# Patient Record
Sex: Male | Born: 1940 | Race: Black or African American | Hispanic: No | Smoking: Former smoker
Health system: Southern US, Community
[De-identification: ages and names within clinical notes are randomized; demographics above are authoritative.]

## PROBLEM LIST (undated history)

## (undated) DIAGNOSIS — E119 Type 2 diabetes mellitus without complications: Secondary | ICD-10-CM

## (undated) DIAGNOSIS — R413 Other amnesia: Secondary | ICD-10-CM

## (undated) DIAGNOSIS — N400 Enlarged prostate without lower urinary tract symptoms: Secondary | ICD-10-CM

## (undated) DIAGNOSIS — E785 Hyperlipidemia, unspecified: Secondary | ICD-10-CM

## (undated) DIAGNOSIS — I10 Essential (primary) hypertension: Secondary | ICD-10-CM

## (undated) DIAGNOSIS — E538 Deficiency of other specified B group vitamins: Secondary | ICD-10-CM

## (undated) HISTORY — DX: Essential (primary) hypertension: I10

## (undated) HISTORY — PX: TOE SURGERY: SHX1073

## (undated) HISTORY — DX: Type 2 diabetes mellitus without complications: E11.9

## (undated) HISTORY — DX: Benign prostatic hyperplasia without lower urinary tract symptoms: N40.0

## (undated) HISTORY — DX: Other amnesia: R41.3

## (undated) HISTORY — DX: Hyperlipidemia, unspecified: E78.5

## (undated) HISTORY — DX: Deficiency of other specified B group vitamins: E53.8

---

## 1998-01-10 HISTORY — PX: SHOULDER SURGERY: SHX246

## 2011-01-19 DIAGNOSIS — IMO0002 Reserved for concepts with insufficient information to code with codable children: Secondary | ICD-10-CM | POA: Diagnosis not present

## 2011-01-19 DIAGNOSIS — M47817 Spondylosis without myelopathy or radiculopathy, lumbosacral region: Secondary | ICD-10-CM | POA: Diagnosis not present

## 2011-01-26 DIAGNOSIS — M47812 Spondylosis without myelopathy or radiculopathy, cervical region: Secondary | ICD-10-CM | POA: Diagnosis not present

## 2011-01-26 DIAGNOSIS — M47817 Spondylosis without myelopathy or radiculopathy, lumbosacral region: Secondary | ICD-10-CM | POA: Diagnosis not present

## 2011-01-26 DIAGNOSIS — Z79899 Other long term (current) drug therapy: Secondary | ICD-10-CM | POA: Diagnosis not present

## 2011-01-26 DIAGNOSIS — IMO0002 Reserved for concepts with insufficient information to code with codable children: Secondary | ICD-10-CM | POA: Diagnosis not present

## 2011-01-26 DIAGNOSIS — M5126 Other intervertebral disc displacement, lumbar region: Secondary | ICD-10-CM | POA: Diagnosis not present

## 2011-01-26 DIAGNOSIS — M545 Low back pain: Secondary | ICD-10-CM | POA: Diagnosis not present

## 2011-02-16 DIAGNOSIS — M47812 Spondylosis without myelopathy or radiculopathy, cervical region: Secondary | ICD-10-CM | POA: Diagnosis not present

## 2011-02-16 DIAGNOSIS — M47817 Spondylosis without myelopathy or radiculopathy, lumbosacral region: Secondary | ICD-10-CM | POA: Diagnosis not present

## 2011-02-16 DIAGNOSIS — IMO0002 Reserved for concepts with insufficient information to code with codable children: Secondary | ICD-10-CM | POA: Diagnosis not present

## 2011-02-23 DIAGNOSIS — IMO0002 Reserved for concepts with insufficient information to code with codable children: Secondary | ICD-10-CM | POA: Diagnosis not present

## 2011-02-23 DIAGNOSIS — M47817 Spondylosis without myelopathy or radiculopathy, lumbosacral region: Secondary | ICD-10-CM | POA: Diagnosis not present

## 2011-03-02 DIAGNOSIS — IMO0002 Reserved for concepts with insufficient information to code with codable children: Secondary | ICD-10-CM | POA: Diagnosis not present

## 2011-03-02 DIAGNOSIS — Z79899 Other long term (current) drug therapy: Secondary | ICD-10-CM | POA: Diagnosis not present

## 2011-03-02 DIAGNOSIS — M25519 Pain in unspecified shoulder: Secondary | ICD-10-CM | POA: Diagnosis not present

## 2011-03-02 DIAGNOSIS — M5126 Other intervertebral disc displacement, lumbar region: Secondary | ICD-10-CM | POA: Diagnosis not present

## 2011-03-02 DIAGNOSIS — M545 Low back pain: Secondary | ICD-10-CM | POA: Diagnosis not present

## 2011-03-02 DIAGNOSIS — M47817 Spondylosis without myelopathy or radiculopathy, lumbosacral region: Secondary | ICD-10-CM | POA: Diagnosis not present

## 2011-03-09 DIAGNOSIS — M25519 Pain in unspecified shoulder: Secondary | ICD-10-CM | POA: Diagnosis not present

## 2011-03-09 DIAGNOSIS — M47817 Spondylosis without myelopathy or radiculopathy, lumbosacral region: Secondary | ICD-10-CM | POA: Diagnosis not present

## 2011-03-09 DIAGNOSIS — IMO0002 Reserved for concepts with insufficient information to code with codable children: Secondary | ICD-10-CM | POA: Diagnosis not present

## 2011-03-16 DIAGNOSIS — M47817 Spondylosis without myelopathy or radiculopathy, lumbosacral region: Secondary | ICD-10-CM | POA: Diagnosis not present

## 2011-03-16 DIAGNOSIS — IMO0002 Reserved for concepts with insufficient information to code with codable children: Secondary | ICD-10-CM | POA: Diagnosis not present

## 2011-03-17 DIAGNOSIS — I10 Essential (primary) hypertension: Secondary | ICD-10-CM | POA: Diagnosis not present

## 2011-03-17 DIAGNOSIS — I2510 Atherosclerotic heart disease of native coronary artery without angina pectoris: Secondary | ICD-10-CM | POA: Diagnosis not present

## 2011-03-23 DIAGNOSIS — IMO0002 Reserved for concepts with insufficient information to code with codable children: Secondary | ICD-10-CM | POA: Diagnosis not present

## 2011-03-23 DIAGNOSIS — M47817 Spondylosis without myelopathy or radiculopathy, lumbosacral region: Secondary | ICD-10-CM | POA: Diagnosis not present

## 2011-04-06 DIAGNOSIS — M25519 Pain in unspecified shoulder: Secondary | ICD-10-CM | POA: Diagnosis not present

## 2011-04-06 DIAGNOSIS — IMO0002 Reserved for concepts with insufficient information to code with codable children: Secondary | ICD-10-CM | POA: Diagnosis not present

## 2011-04-06 DIAGNOSIS — R269 Unspecified abnormalities of gait and mobility: Secondary | ICD-10-CM | POA: Diagnosis not present

## 2011-04-06 DIAGNOSIS — M47817 Spondylosis without myelopathy or radiculopathy, lumbosacral region: Secondary | ICD-10-CM | POA: Diagnosis not present

## 2011-04-13 DIAGNOSIS — M25519 Pain in unspecified shoulder: Secondary | ICD-10-CM | POA: Diagnosis not present

## 2011-04-13 DIAGNOSIS — IMO0002 Reserved for concepts with insufficient information to code with codable children: Secondary | ICD-10-CM | POA: Diagnosis not present

## 2011-04-13 DIAGNOSIS — M47817 Spondylosis without myelopathy or radiculopathy, lumbosacral region: Secondary | ICD-10-CM | POA: Diagnosis not present

## 2011-04-20 DIAGNOSIS — M47817 Spondylosis without myelopathy or radiculopathy, lumbosacral region: Secondary | ICD-10-CM | POA: Diagnosis not present

## 2011-04-20 DIAGNOSIS — M47812 Spondylosis without myelopathy or radiculopathy, cervical region: Secondary | ICD-10-CM | POA: Diagnosis not present

## 2011-04-20 DIAGNOSIS — IMO0002 Reserved for concepts with insufficient information to code with codable children: Secondary | ICD-10-CM | POA: Diagnosis not present

## 2011-04-27 DIAGNOSIS — IMO0002 Reserved for concepts with insufficient information to code with codable children: Secondary | ICD-10-CM | POA: Diagnosis not present

## 2011-04-27 DIAGNOSIS — M47817 Spondylosis without myelopathy or radiculopathy, lumbosacral region: Secondary | ICD-10-CM | POA: Diagnosis not present

## 2011-04-27 DIAGNOSIS — M25519 Pain in unspecified shoulder: Secondary | ICD-10-CM | POA: Diagnosis not present

## 2011-05-04 DIAGNOSIS — M47817 Spondylosis without myelopathy or radiculopathy, lumbosacral region: Secondary | ICD-10-CM | POA: Diagnosis not present

## 2011-05-04 DIAGNOSIS — Z79899 Other long term (current) drug therapy: Secondary | ICD-10-CM | POA: Diagnosis not present

## 2011-05-04 DIAGNOSIS — M25519 Pain in unspecified shoulder: Secondary | ICD-10-CM | POA: Diagnosis not present

## 2011-05-04 DIAGNOSIS — M5126 Other intervertebral disc displacement, lumbar region: Secondary | ICD-10-CM | POA: Diagnosis not present

## 2011-05-04 DIAGNOSIS — IMO0002 Reserved for concepts with insufficient information to code with codable children: Secondary | ICD-10-CM | POA: Diagnosis not present

## 2011-05-04 DIAGNOSIS — M545 Low back pain: Secondary | ICD-10-CM | POA: Diagnosis not present

## 2011-05-18 DIAGNOSIS — IMO0002 Reserved for concepts with insufficient information to code with codable children: Secondary | ICD-10-CM | POA: Diagnosis not present

## 2011-05-18 DIAGNOSIS — M25519 Pain in unspecified shoulder: Secondary | ICD-10-CM | POA: Diagnosis not present

## 2011-05-18 DIAGNOSIS — M47817 Spondylosis without myelopathy or radiculopathy, lumbosacral region: Secondary | ICD-10-CM | POA: Diagnosis not present

## 2011-05-25 DIAGNOSIS — IMO0002 Reserved for concepts with insufficient information to code with codable children: Secondary | ICD-10-CM | POA: Diagnosis not present

## 2011-05-25 DIAGNOSIS — M19019 Primary osteoarthritis, unspecified shoulder: Secondary | ICD-10-CM | POA: Diagnosis not present

## 2011-05-25 DIAGNOSIS — M47817 Spondylosis without myelopathy or radiculopathy, lumbosacral region: Secondary | ICD-10-CM | POA: Diagnosis not present

## 2011-06-01 DIAGNOSIS — M47817 Spondylosis without myelopathy or radiculopathy, lumbosacral region: Secondary | ICD-10-CM | POA: Diagnosis not present

## 2011-06-01 DIAGNOSIS — IMO0002 Reserved for concepts with insufficient information to code with codable children: Secondary | ICD-10-CM | POA: Diagnosis not present

## 2011-06-08 DIAGNOSIS — M47817 Spondylosis without myelopathy or radiculopathy, lumbosacral region: Secondary | ICD-10-CM | POA: Diagnosis not present

## 2011-06-08 DIAGNOSIS — IMO0002 Reserved for concepts with insufficient information to code with codable children: Secondary | ICD-10-CM | POA: Diagnosis not present

## 2011-06-15 DIAGNOSIS — Z79899 Other long term (current) drug therapy: Secondary | ICD-10-CM | POA: Diagnosis not present

## 2011-06-15 DIAGNOSIS — IMO0002 Reserved for concepts with insufficient information to code with codable children: Secondary | ICD-10-CM | POA: Diagnosis not present

## 2011-06-15 DIAGNOSIS — M47817 Spondylosis without myelopathy or radiculopathy, lumbosacral region: Secondary | ICD-10-CM | POA: Diagnosis not present

## 2011-06-15 DIAGNOSIS — M545 Low back pain: Secondary | ICD-10-CM | POA: Diagnosis not present

## 2011-06-22 DIAGNOSIS — M47817 Spondylosis without myelopathy or radiculopathy, lumbosacral region: Secondary | ICD-10-CM | POA: Diagnosis not present

## 2011-06-22 DIAGNOSIS — IMO0002 Reserved for concepts with insufficient information to code with codable children: Secondary | ICD-10-CM | POA: Diagnosis not present

## 2011-06-22 DIAGNOSIS — M25519 Pain in unspecified shoulder: Secondary | ICD-10-CM | POA: Diagnosis not present

## 2011-06-23 DIAGNOSIS — I2510 Atherosclerotic heart disease of native coronary artery without angina pectoris: Secondary | ICD-10-CM | POA: Diagnosis not present

## 2011-06-23 DIAGNOSIS — I10 Essential (primary) hypertension: Secondary | ICD-10-CM | POA: Diagnosis not present

## 2011-06-29 DIAGNOSIS — IMO0002 Reserved for concepts with insufficient information to code with codable children: Secondary | ICD-10-CM | POA: Diagnosis not present

## 2011-06-29 DIAGNOSIS — M47817 Spondylosis without myelopathy or radiculopathy, lumbosacral region: Secondary | ICD-10-CM | POA: Diagnosis not present

## 2011-07-06 DIAGNOSIS — M47817 Spondylosis without myelopathy or radiculopathy, lumbosacral region: Secondary | ICD-10-CM | POA: Diagnosis not present

## 2011-07-06 DIAGNOSIS — IMO0002 Reserved for concepts with insufficient information to code with codable children: Secondary | ICD-10-CM | POA: Diagnosis not present

## 2011-07-06 DIAGNOSIS — M25519 Pain in unspecified shoulder: Secondary | ICD-10-CM | POA: Diagnosis not present

## 2011-07-13 DIAGNOSIS — IMO0002 Reserved for concepts with insufficient information to code with codable children: Secondary | ICD-10-CM | POA: Diagnosis not present

## 2011-07-13 DIAGNOSIS — M47817 Spondylosis without myelopathy or radiculopathy, lumbosacral region: Secondary | ICD-10-CM | POA: Diagnosis not present

## 2011-07-20 DIAGNOSIS — M47817 Spondylosis without myelopathy or radiculopathy, lumbosacral region: Secondary | ICD-10-CM | POA: Diagnosis not present

## 2011-07-20 DIAGNOSIS — M25519 Pain in unspecified shoulder: Secondary | ICD-10-CM | POA: Diagnosis not present

## 2011-07-20 DIAGNOSIS — Z79899 Other long term (current) drug therapy: Secondary | ICD-10-CM | POA: Diagnosis not present

## 2011-07-20 DIAGNOSIS — M545 Low back pain: Secondary | ICD-10-CM | POA: Diagnosis not present

## 2011-07-20 DIAGNOSIS — IMO0002 Reserved for concepts with insufficient information to code with codable children: Secondary | ICD-10-CM | POA: Diagnosis not present

## 2011-07-25 DIAGNOSIS — D649 Anemia, unspecified: Secondary | ICD-10-CM | POA: Diagnosis not present

## 2011-07-25 DIAGNOSIS — E789 Disorder of lipoprotein metabolism, unspecified: Secondary | ICD-10-CM | POA: Diagnosis not present

## 2011-07-25 DIAGNOSIS — I10 Essential (primary) hypertension: Secondary | ICD-10-CM | POA: Diagnosis not present

## 2011-07-25 DIAGNOSIS — I2510 Atherosclerotic heart disease of native coronary artery without angina pectoris: Secondary | ICD-10-CM | POA: Diagnosis not present

## 2011-07-25 DIAGNOSIS — R809 Proteinuria, unspecified: Secondary | ICD-10-CM | POA: Diagnosis not present

## 2011-07-27 DIAGNOSIS — M47817 Spondylosis without myelopathy or radiculopathy, lumbosacral region: Secondary | ICD-10-CM | POA: Diagnosis not present

## 2011-07-27 DIAGNOSIS — IMO0002 Reserved for concepts with insufficient information to code with codable children: Secondary | ICD-10-CM | POA: Diagnosis not present

## 2011-08-17 DIAGNOSIS — Z79899 Other long term (current) drug therapy: Secondary | ICD-10-CM | POA: Diagnosis not present

## 2011-08-17 DIAGNOSIS — M545 Low back pain: Secondary | ICD-10-CM | POA: Diagnosis not present

## 2011-08-17 DIAGNOSIS — M47817 Spondylosis without myelopathy or radiculopathy, lumbosacral region: Secondary | ICD-10-CM | POA: Diagnosis not present

## 2011-08-17 DIAGNOSIS — IMO0002 Reserved for concepts with insufficient information to code with codable children: Secondary | ICD-10-CM | POA: Diagnosis not present

## 2011-08-24 DIAGNOSIS — IMO0002 Reserved for concepts with insufficient information to code with codable children: Secondary | ICD-10-CM | POA: Diagnosis not present

## 2011-08-24 DIAGNOSIS — M47817 Spondylosis without myelopathy or radiculopathy, lumbosacral region: Secondary | ICD-10-CM | POA: Diagnosis not present

## 2011-08-25 DIAGNOSIS — I10 Essential (primary) hypertension: Secondary | ICD-10-CM | POA: Diagnosis not present

## 2011-08-25 DIAGNOSIS — R319 Hematuria, unspecified: Secondary | ICD-10-CM | POA: Diagnosis not present

## 2011-08-25 DIAGNOSIS — M545 Low back pain: Secondary | ICD-10-CM | POA: Diagnosis not present

## 2011-08-25 DIAGNOSIS — I2510 Atherosclerotic heart disease of native coronary artery without angina pectoris: Secondary | ICD-10-CM | POA: Diagnosis not present

## 2011-08-31 DIAGNOSIS — IMO0002 Reserved for concepts with insufficient information to code with codable children: Secondary | ICD-10-CM | POA: Diagnosis not present

## 2011-08-31 DIAGNOSIS — M47817 Spondylosis without myelopathy or radiculopathy, lumbosacral region: Secondary | ICD-10-CM | POA: Diagnosis not present

## 2011-09-06 DIAGNOSIS — R319 Hematuria, unspecified: Secondary | ICD-10-CM | POA: Diagnosis not present

## 2011-09-06 DIAGNOSIS — K4090 Unilateral inguinal hernia, without obstruction or gangrene, not specified as recurrent: Secondary | ICD-10-CM | POA: Diagnosis not present

## 2011-09-07 DIAGNOSIS — IMO0002 Reserved for concepts with insufficient information to code with codable children: Secondary | ICD-10-CM | POA: Diagnosis not present

## 2011-09-07 DIAGNOSIS — M47817 Spondylosis without myelopathy or radiculopathy, lumbosacral region: Secondary | ICD-10-CM | POA: Diagnosis not present

## 2011-09-22 DIAGNOSIS — R3129 Other microscopic hematuria: Secondary | ICD-10-CM | POA: Diagnosis not present

## 2011-09-28 DIAGNOSIS — M47817 Spondylosis without myelopathy or radiculopathy, lumbosacral region: Secondary | ICD-10-CM | POA: Diagnosis not present

## 2011-09-28 DIAGNOSIS — M25519 Pain in unspecified shoulder: Secondary | ICD-10-CM | POA: Diagnosis not present

## 2011-09-30 DIAGNOSIS — R7989 Other specified abnormal findings of blood chemistry: Secondary | ICD-10-CM | POA: Diagnosis not present

## 2011-09-30 DIAGNOSIS — R042 Hemoptysis: Secondary | ICD-10-CM | POA: Diagnosis not present

## 2011-09-30 DIAGNOSIS — R972 Elevated prostate specific antigen [PSA]: Secondary | ICD-10-CM | POA: Diagnosis not present

## 2011-09-30 DIAGNOSIS — R634 Abnormal weight loss: Secondary | ICD-10-CM | POA: Diagnosis not present

## 2011-10-05 DIAGNOSIS — M25519 Pain in unspecified shoulder: Secondary | ICD-10-CM | POA: Diagnosis not present

## 2011-10-05 DIAGNOSIS — IMO0002 Reserved for concepts with insufficient information to code with codable children: Secondary | ICD-10-CM | POA: Diagnosis not present

## 2011-10-05 DIAGNOSIS — M47817 Spondylosis without myelopathy or radiculopathy, lumbosacral region: Secondary | ICD-10-CM | POA: Diagnosis not present

## 2011-10-07 DIAGNOSIS — R3129 Other microscopic hematuria: Secondary | ICD-10-CM | POA: Diagnosis not present

## 2011-10-07 DIAGNOSIS — N189 Chronic kidney disease, unspecified: Secondary | ICD-10-CM | POA: Diagnosis not present

## 2011-10-07 DIAGNOSIS — R3989 Other symptoms and signs involving the genitourinary system: Secondary | ICD-10-CM | POA: Diagnosis not present

## 2011-10-12 DIAGNOSIS — M545 Low back pain: Secondary | ICD-10-CM | POA: Diagnosis not present

## 2011-10-12 DIAGNOSIS — M47817 Spondylosis without myelopathy or radiculopathy, lumbosacral region: Secondary | ICD-10-CM | POA: Diagnosis not present

## 2011-10-12 DIAGNOSIS — IMO0002 Reserved for concepts with insufficient information to code with codable children: Secondary | ICD-10-CM | POA: Diagnosis not present

## 2011-10-12 DIAGNOSIS — M25519 Pain in unspecified shoulder: Secondary | ICD-10-CM | POA: Diagnosis not present

## 2011-10-12 DIAGNOSIS — Z79899 Other long term (current) drug therapy: Secondary | ICD-10-CM | POA: Diagnosis not present

## 2011-10-13 DIAGNOSIS — R319 Hematuria, unspecified: Secondary | ICD-10-CM | POA: Diagnosis not present

## 2011-10-13 DIAGNOSIS — I2510 Atherosclerotic heart disease of native coronary artery without angina pectoris: Secondary | ICD-10-CM | POA: Diagnosis not present

## 2011-10-13 DIAGNOSIS — I10 Essential (primary) hypertension: Secondary | ICD-10-CM | POA: Diagnosis not present

## 2011-10-14 DIAGNOSIS — N400 Enlarged prostate without lower urinary tract symptoms: Secondary | ICD-10-CM | POA: Diagnosis not present

## 2011-10-14 DIAGNOSIS — R3129 Other microscopic hematuria: Secondary | ICD-10-CM | POA: Diagnosis not present

## 2011-10-26 DIAGNOSIS — M25519 Pain in unspecified shoulder: Secondary | ICD-10-CM | POA: Diagnosis not present

## 2011-10-26 DIAGNOSIS — M47817 Spondylosis without myelopathy or radiculopathy, lumbosacral region: Secondary | ICD-10-CM | POA: Diagnosis not present

## 2011-10-26 DIAGNOSIS — IMO0002 Reserved for concepts with insufficient information to code with codable children: Secondary | ICD-10-CM | POA: Diagnosis not present

## 2011-11-02 DIAGNOSIS — IMO0002 Reserved for concepts with insufficient information to code with codable children: Secondary | ICD-10-CM | POA: Diagnosis not present

## 2011-11-02 DIAGNOSIS — M25519 Pain in unspecified shoulder: Secondary | ICD-10-CM | POA: Diagnosis not present

## 2011-11-02 DIAGNOSIS — M47817 Spondylosis without myelopathy or radiculopathy, lumbosacral region: Secondary | ICD-10-CM | POA: Diagnosis not present

## 2011-11-11 DIAGNOSIS — IMO0002 Reserved for concepts with insufficient information to code with codable children: Secondary | ICD-10-CM | POA: Diagnosis not present

## 2011-11-11 DIAGNOSIS — M47817 Spondylosis without myelopathy or radiculopathy, lumbosacral region: Secondary | ICD-10-CM | POA: Diagnosis not present

## 2011-11-16 DIAGNOSIS — M25519 Pain in unspecified shoulder: Secondary | ICD-10-CM | POA: Diagnosis not present

## 2011-11-16 DIAGNOSIS — IMO0002 Reserved for concepts with insufficient information to code with codable children: Secondary | ICD-10-CM | POA: Diagnosis not present

## 2011-11-16 DIAGNOSIS — M47817 Spondylosis without myelopathy or radiculopathy, lumbosacral region: Secondary | ICD-10-CM | POA: Diagnosis not present

## 2011-11-23 DIAGNOSIS — M47817 Spondylosis without myelopathy or radiculopathy, lumbosacral region: Secondary | ICD-10-CM | POA: Diagnosis not present

## 2011-11-23 DIAGNOSIS — IMO0002 Reserved for concepts with insufficient information to code with codable children: Secondary | ICD-10-CM | POA: Diagnosis not present

## 2011-11-25 DIAGNOSIS — R079 Chest pain, unspecified: Secondary | ICD-10-CM | POA: Diagnosis not present

## 2011-11-25 DIAGNOSIS — I2510 Atherosclerotic heart disease of native coronary artery without angina pectoris: Secondary | ICD-10-CM | POA: Diagnosis not present

## 2011-11-30 DIAGNOSIS — M47817 Spondylosis without myelopathy or radiculopathy, lumbosacral region: Secondary | ICD-10-CM | POA: Diagnosis not present

## 2011-11-30 DIAGNOSIS — IMO0002 Reserved for concepts with insufficient information to code with codable children: Secondary | ICD-10-CM | POA: Diagnosis not present

## 2011-11-30 DIAGNOSIS — M25519 Pain in unspecified shoulder: Secondary | ICD-10-CM | POA: Diagnosis not present

## 2011-11-30 DIAGNOSIS — M545 Low back pain: Secondary | ICD-10-CM | POA: Diagnosis not present

## 2011-11-30 DIAGNOSIS — Z79899 Other long term (current) drug therapy: Secondary | ICD-10-CM | POA: Diagnosis not present

## 2011-12-01 DIAGNOSIS — I2510 Atherosclerotic heart disease of native coronary artery without angina pectoris: Secondary | ICD-10-CM | POA: Diagnosis not present

## 2011-12-01 DIAGNOSIS — I517 Cardiomegaly: Secondary | ICD-10-CM | POA: Diagnosis not present

## 2011-12-01 DIAGNOSIS — I10 Essential (primary) hypertension: Secondary | ICD-10-CM | POA: Diagnosis not present

## 2011-12-07 DIAGNOSIS — M47817 Spondylosis without myelopathy or radiculopathy, lumbosacral region: Secondary | ICD-10-CM | POA: Diagnosis not present

## 2011-12-14 DIAGNOSIS — M47817 Spondylosis without myelopathy or radiculopathy, lumbosacral region: Secondary | ICD-10-CM | POA: Diagnosis not present

## 2011-12-21 DIAGNOSIS — M47817 Spondylosis without myelopathy or radiculopathy, lumbosacral region: Secondary | ICD-10-CM | POA: Diagnosis not present

## 2011-12-28 DIAGNOSIS — M47817 Spondylosis without myelopathy or radiculopathy, lumbosacral region: Secondary | ICD-10-CM | POA: Diagnosis not present

## 2012-01-18 DIAGNOSIS — M5126 Other intervertebral disc displacement, lumbar region: Secondary | ICD-10-CM | POA: Diagnosis not present

## 2012-01-18 DIAGNOSIS — M47817 Spondylosis without myelopathy or radiculopathy, lumbosacral region: Secondary | ICD-10-CM | POA: Diagnosis not present

## 2012-01-21 DIAGNOSIS — M47817 Spondylosis without myelopathy or radiculopathy, lumbosacral region: Secondary | ICD-10-CM | POA: Diagnosis not present

## 2012-01-21 DIAGNOSIS — Z79899 Other long term (current) drug therapy: Secondary | ICD-10-CM | POA: Diagnosis not present

## 2012-01-25 DIAGNOSIS — M47817 Spondylosis without myelopathy or radiculopathy, lumbosacral region: Secondary | ICD-10-CM | POA: Diagnosis not present

## 2012-02-01 DIAGNOSIS — M47817 Spondylosis without myelopathy or radiculopathy, lumbosacral region: Secondary | ICD-10-CM | POA: Diagnosis not present

## 2012-02-06 DIAGNOSIS — M47817 Spondylosis without myelopathy or radiculopathy, lumbosacral region: Secondary | ICD-10-CM | POA: Diagnosis not present

## 2012-02-09 DIAGNOSIS — R9431 Abnormal electrocardiogram [ECG] [EKG]: Secondary | ICD-10-CM | POA: Diagnosis not present

## 2012-02-09 DIAGNOSIS — I10 Essential (primary) hypertension: Secondary | ICD-10-CM | POA: Diagnosis not present

## 2012-02-09 DIAGNOSIS — I499 Cardiac arrhythmia, unspecified: Secondary | ICD-10-CM | POA: Diagnosis not present

## 2012-02-09 DIAGNOSIS — I2510 Atherosclerotic heart disease of native coronary artery without angina pectoris: Secondary | ICD-10-CM | POA: Diagnosis not present

## 2012-03-23 DIAGNOSIS — I514 Myocarditis, unspecified: Secondary | ICD-10-CM | POA: Diagnosis not present

## 2012-04-03 DIAGNOSIS — R319 Hematuria, unspecified: Secondary | ICD-10-CM | POA: Diagnosis not present

## 2012-04-03 DIAGNOSIS — I498 Other specified cardiac arrhythmias: Secondary | ICD-10-CM | POA: Diagnosis not present

## 2012-04-06 DIAGNOSIS — Z79899 Other long term (current) drug therapy: Secondary | ICD-10-CM | POA: Diagnosis not present

## 2012-04-06 DIAGNOSIS — M47817 Spondylosis without myelopathy or radiculopathy, lumbosacral region: Secondary | ICD-10-CM | POA: Diagnosis not present

## 2012-04-27 DIAGNOSIS — M47817 Spondylosis without myelopathy or radiculopathy, lumbosacral region: Secondary | ICD-10-CM | POA: Diagnosis not present

## 2012-05-04 DIAGNOSIS — H01009 Unspecified blepharitis unspecified eye, unspecified eyelid: Secondary | ICD-10-CM | POA: Diagnosis not present

## 2012-05-04 DIAGNOSIS — H5200 Hypermetropia, unspecified eye: Secondary | ICD-10-CM | POA: Diagnosis not present

## 2012-05-04 DIAGNOSIS — H2510 Age-related nuclear cataract, unspecified eye: Secondary | ICD-10-CM | POA: Diagnosis not present

## 2012-05-04 DIAGNOSIS — H43819 Vitreous degeneration, unspecified eye: Secondary | ICD-10-CM | POA: Diagnosis not present

## 2012-05-07 DIAGNOSIS — H9190 Unspecified hearing loss, unspecified ear: Secondary | ICD-10-CM | POA: Diagnosis not present

## 2012-05-30 DIAGNOSIS — H903 Sensorineural hearing loss, bilateral: Secondary | ICD-10-CM | POA: Diagnosis not present

## 2012-08-17 DIAGNOSIS — H903 Sensorineural hearing loss, bilateral: Secondary | ICD-10-CM | POA: Diagnosis not present

## 2012-10-30 ENCOUNTER — Other Ambulatory Visit: Payer: Self-pay | Admitting: Internal Medicine

## 2012-10-30 ENCOUNTER — Ambulatory Visit
Admission: RE | Admit: 2012-10-30 | Discharge: 2012-10-30 | Disposition: A | Discharge status: Home / Self Care | Payer: Medicare Other | Source: Ambulatory Visit | Attending: Internal Medicine | Admitting: Internal Medicine

## 2012-10-30 DIAGNOSIS — M25552 Pain in left hip: Secondary | ICD-10-CM

## 2012-10-30 DIAGNOSIS — M545 Low back pain, unspecified: Secondary | ICD-10-CM

## 2012-10-30 DIAGNOSIS — M25559 Pain in unspecified hip: Secondary | ICD-10-CM | POA: Diagnosis not present

## 2012-10-30 DIAGNOSIS — Z23 Encounter for immunization: Secondary | ICD-10-CM | POA: Diagnosis not present

## 2012-10-30 DIAGNOSIS — R030 Elevated blood-pressure reading, without diagnosis of hypertension: Secondary | ICD-10-CM | POA: Diagnosis not present

## 2012-10-30 DIAGNOSIS — M542 Cervicalgia: Secondary | ICD-10-CM | POA: Diagnosis not present

## 2012-10-30 DIAGNOSIS — M47817 Spondylosis without myelopathy or radiculopathy, lumbosacral region: Secondary | ICD-10-CM | POA: Diagnosis not present

## 2012-11-08 DIAGNOSIS — R7989 Other specified abnormal findings of blood chemistry: Secondary | ICD-10-CM | POA: Diagnosis not present

## 2012-11-08 DIAGNOSIS — R7309 Other abnormal glucose: Secondary | ICD-10-CM | POA: Diagnosis not present

## 2012-11-15 DIAGNOSIS — I10 Essential (primary) hypertension: Secondary | ICD-10-CM | POA: Diagnosis not present

## 2012-11-15 DIAGNOSIS — E119 Type 2 diabetes mellitus without complications: Secondary | ICD-10-CM | POA: Diagnosis not present

## 2012-11-15 DIAGNOSIS — E785 Hyperlipidemia, unspecified: Secondary | ICD-10-CM | POA: Diagnosis not present

## 2013-02-08 DIAGNOSIS — R7989 Other specified abnormal findings of blood chemistry: Secondary | ICD-10-CM | POA: Diagnosis not present

## 2013-02-12 DIAGNOSIS — Z23 Encounter for immunization: Secondary | ICD-10-CM | POA: Diagnosis not present

## 2013-02-12 DIAGNOSIS — R413 Other amnesia: Secondary | ICD-10-CM | POA: Diagnosis not present

## 2013-02-12 DIAGNOSIS — I10 Essential (primary) hypertension: Secondary | ICD-10-CM | POA: Diagnosis not present

## 2013-02-12 DIAGNOSIS — E119 Type 2 diabetes mellitus without complications: Secondary | ICD-10-CM | POA: Diagnosis not present

## 2013-02-12 DIAGNOSIS — M25519 Pain in unspecified shoulder: Secondary | ICD-10-CM | POA: Diagnosis not present

## 2013-02-12 DIAGNOSIS — Z1331 Encounter for screening for depression: Secondary | ICD-10-CM | POA: Diagnosis not present

## 2013-02-13 DIAGNOSIS — M719 Bursopathy, unspecified: Secondary | ICD-10-CM | POA: Diagnosis not present

## 2013-02-13 DIAGNOSIS — M67919 Unspecified disorder of synovium and tendon, unspecified shoulder: Secondary | ICD-10-CM | POA: Diagnosis not present

## 2013-02-13 DIAGNOSIS — M5030 Other cervical disc degeneration, unspecified cervical region: Secondary | ICD-10-CM | POA: Diagnosis not present

## 2013-02-13 DIAGNOSIS — M545 Low back pain, unspecified: Secondary | ICD-10-CM | POA: Diagnosis not present

## 2013-02-21 DIAGNOSIS — M67919 Unspecified disorder of synovium and tendon, unspecified shoulder: Secondary | ICD-10-CM | POA: Diagnosis not present

## 2013-02-21 DIAGNOSIS — M719 Bursopathy, unspecified: Secondary | ICD-10-CM | POA: Diagnosis not present

## 2013-02-21 DIAGNOSIS — M25519 Pain in unspecified shoulder: Secondary | ICD-10-CM | POA: Diagnosis not present

## 2013-02-26 ENCOUNTER — Ambulatory Visit: Payer: Medicare Other | Admitting: Diagnostic Neuroimaging

## 2013-02-27 DIAGNOSIS — M25519 Pain in unspecified shoulder: Secondary | ICD-10-CM | POA: Diagnosis not present

## 2013-02-28 DIAGNOSIS — M25519 Pain in unspecified shoulder: Secondary | ICD-10-CM | POA: Diagnosis not present

## 2013-02-28 DIAGNOSIS — M67919 Unspecified disorder of synovium and tendon, unspecified shoulder: Secondary | ICD-10-CM | POA: Diagnosis not present

## 2013-02-28 DIAGNOSIS — M719 Bursopathy, unspecified: Secondary | ICD-10-CM | POA: Diagnosis not present

## 2013-03-07 DIAGNOSIS — M67919 Unspecified disorder of synovium and tendon, unspecified shoulder: Secondary | ICD-10-CM | POA: Diagnosis not present

## 2013-03-07 DIAGNOSIS — M719 Bursopathy, unspecified: Secondary | ICD-10-CM | POA: Diagnosis not present

## 2013-03-07 DIAGNOSIS — M25519 Pain in unspecified shoulder: Secondary | ICD-10-CM | POA: Diagnosis not present

## 2013-03-11 DIAGNOSIS — M25519 Pain in unspecified shoulder: Secondary | ICD-10-CM | POA: Diagnosis not present

## 2013-03-12 DIAGNOSIS — R413 Other amnesia: Secondary | ICD-10-CM | POA: Diagnosis not present

## 2013-03-15 DIAGNOSIS — M25519 Pain in unspecified shoulder: Secondary | ICD-10-CM | POA: Diagnosis not present

## 2013-03-20 ENCOUNTER — Ambulatory Visit: Payer: Self-pay

## 2013-03-20 ENCOUNTER — Other Ambulatory Visit: Payer: Self-pay | Admitting: Internal Medicine

## 2013-03-20 DIAGNOSIS — M25519 Pain in unspecified shoulder: Secondary | ICD-10-CM | POA: Diagnosis not present

## 2013-03-20 DIAGNOSIS — M5430 Sciatica, unspecified side: Secondary | ICD-10-CM

## 2013-03-27 DIAGNOSIS — M542 Cervicalgia: Secondary | ICD-10-CM | POA: Diagnosis not present

## 2013-04-02 DIAGNOSIS — M25519 Pain in unspecified shoulder: Secondary | ICD-10-CM | POA: Diagnosis not present

## 2013-04-05 DIAGNOSIS — M25519 Pain in unspecified shoulder: Secondary | ICD-10-CM | POA: Diagnosis not present

## 2013-04-05 DIAGNOSIS — M67919 Unspecified disorder of synovium and tendon, unspecified shoulder: Secondary | ICD-10-CM | POA: Diagnosis not present

## 2013-04-05 DIAGNOSIS — M719 Bursopathy, unspecified: Secondary | ICD-10-CM | POA: Diagnosis not present

## 2013-04-05 DIAGNOSIS — M542 Cervicalgia: Secondary | ICD-10-CM | POA: Diagnosis not present

## 2013-04-09 DIAGNOSIS — M25519 Pain in unspecified shoulder: Secondary | ICD-10-CM | POA: Diagnosis not present

## 2013-05-09 DIAGNOSIS — Z125 Encounter for screening for malignant neoplasm of prostate: Secondary | ICD-10-CM | POA: Diagnosis not present

## 2013-05-09 DIAGNOSIS — Z1331 Encounter for screening for depression: Secondary | ICD-10-CM | POA: Diagnosis not present

## 2013-05-09 DIAGNOSIS — E538 Deficiency of other specified B group vitamins: Secondary | ICD-10-CM | POA: Diagnosis not present

## 2013-05-09 DIAGNOSIS — E119 Type 2 diabetes mellitus without complications: Secondary | ICD-10-CM | POA: Diagnosis not present

## 2013-05-09 DIAGNOSIS — E785 Hyperlipidemia, unspecified: Secondary | ICD-10-CM | POA: Diagnosis not present

## 2013-05-09 DIAGNOSIS — R413 Other amnesia: Secondary | ICD-10-CM | POA: Diagnosis not present

## 2013-05-09 DIAGNOSIS — I10 Essential (primary) hypertension: Secondary | ICD-10-CM | POA: Diagnosis not present

## 2013-05-09 DIAGNOSIS — Z0000 Encounter for general adult medical examination without abnormal findings: Secondary | ICD-10-CM | POA: Diagnosis not present

## 2013-05-21 ENCOUNTER — Encounter (INDEPENDENT_AMBULATORY_CARE_PROVIDER_SITE_OTHER): Payer: Self-pay

## 2013-05-21 ENCOUNTER — Encounter: Payer: Self-pay | Admitting: Diagnostic Neuroimaging

## 2013-05-21 ENCOUNTER — Ambulatory Visit (INDEPENDENT_AMBULATORY_CARE_PROVIDER_SITE_OTHER): Payer: Medicare Other | Admitting: Diagnostic Neuroimaging

## 2013-05-21 VITALS — BP 132/78 | HR 65 | Ht 66.0 in | Wt 165.0 lb

## 2013-05-21 DIAGNOSIS — I10 Essential (primary) hypertension: Secondary | ICD-10-CM

## 2013-05-21 DIAGNOSIS — R413 Other amnesia: Secondary | ICD-10-CM

## 2013-05-21 DIAGNOSIS — E538 Deficiency of other specified B group vitamins: Secondary | ICD-10-CM

## 2013-05-21 DIAGNOSIS — F0390 Unspecified dementia without behavioral disturbance: Secondary | ICD-10-CM

## 2013-05-21 DIAGNOSIS — F03B0 Unspecified dementia, moderate, without behavioral disturbance, psychotic disturbance, mood disturbance, and anxiety: Secondary | ICD-10-CM

## 2013-05-21 NOTE — Patient Instructions (Signed)
I will check MRI brain.  Monitor symptoms.

## 2013-05-21 NOTE — Progress Notes (Signed)
[facility]  PATIENT: John Valdez DOB: 1940-01-31  REFERRING CLINICIAN: Polite HISTORY FROM: patient, John Valdez, John Valdez(John Valdez, who is also a patient of mine) REASON FOR VISIT: new consult   HISTORICAL  CHIEF COMPLAINT:  Chief Complaint  Patient presents with  . Dementia    HISTORY OF PRESENT ILLNESS:   73 year old left-handed male here for evaluation of possible dementia. Patient had limited schooling. He is illiterate. 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 He retired approximately 10-15 years ago. Patient and his John, who is also patient of mine, removed to [place] approximately one year ago to live with their John Valdez and her family as they were not able to take care of themselves.  Patient denies any significant memory problems. Patient's John Valdez has not noted any definite progressive memory problems. Patient was evaluated at PCP office who was concerned for possibility of dementia. According to John Valdez, patient has always been and then a few words throughout his life. They were never sure if this was related to his educational background or personality. Since moving to [place], patient's John Valdez has noted a few difficulties with his cognitive ability. He tends to repeat himself when telling stories. Sometimes he cannot hear what is said to him. Recent has hearing aids but does not use them. Patient's John Valdez has not noted any change in his cognitive ability whether he has a hearing aid in or not. Patient having trouble with short-term memory problems. For example when playing pool, he sometimes forgets which balls he is supposed to be shooting. Patient having some difficulty with taking his medication, although he has never taken medication until just a few months ago. Patient does not have a driver's license, however sometimes he drives when his John Valdez is in the car.   REVIEW OF SYSTEMS: Full 14 system  review of systems performed and notable only for weight gain chest pain hearing loss shortness of breath increased thirst dizziness memory loss headache sleepiness snoring and asleep disinterest in activities.  ALLERGIES: Allergies  Allergen Reactions  . Other     seasonal    HOME MEDICATIONS: No outpatient prescriptions prior to visit.   No facility-administered medications prior to visit.    PAST MEDICAL HISTORY: History reviewed. No pertinent past medical history.  PAST SURGICAL HISTORY: Past Surgical History  Procedure Laterality Date  . Shoulder surgery Left 2000    FAMILY HISTORY: Family History  Problem Relation Age of Onset  . Aneurysm Mother   . Cancer Father     SOCIAL HISTORY:  History   Social History  . Marital Status: Married    Spouse Name: John FerrariViola    Number of Children: 4  . Years of Education: N/A   Occupational History  . Retired    Social History Main Topics  . Smoking status: Former Smoker -- 25 years    Types: Cigars    Quit date: 01/11/2008  . Smokeless tobacco: Never Used  . Alcohol Use: No     Comment: quit 30 yrs ago  . Drug Use: No  . Sexual Activity: Not on file   Other Topics Concern  . Not on file   Social History Narrative   Patient lives at home with family.   Caffeine use: 2 cups daily     PHYSICAL EXAM  Filed Vitals:   05/21/13 1000  BP: 132/78  Pulse: 65  Height: 5\' 6"  (1.676 m)  Weight: 165 lb (  74.844 kg)    Not recorded    Body mass index is 26.64 kg/(m^2).  GENERAL EXAM: Patient is in no distress; well developed, nourished and groomed; neck is supple  CARDIOVASCULAR: Regular rate and rhythm, no murmurs, no carotid bruits  NEUROLOGIC: MENTAL STATUS: awake, alert; DECR FLUENCY. FOLLOWS COMMANDS. NAMING INTACT. MMSE 15/30. CANNOT READ OR WRITE. Oriented to person (YEAR = 15, SEASON = ?, COUNTY = [place], STATE = [place], CITY = [place], DAY = Tuesday, DATE = 2015). REGISTERS 3/3. RECALLS  1/3. POSITIVE SNOUT. NEG MYERSONS, NEG PALMOMENTAL. GDS 4. CRANIAL NERVE: no papilledema on fundoscopic exam, pupils equal and reactive to light, visual fields full to confrontation, extraocular muscles intact, no nystagmus, facial sensation and strength symmetric, hearing intact, palate elevates symmetrically, uvula midline, shoulder shrug symmetric, tongue midline. MOTOR: normal bulk and tone, full strength in the BUE, BLE SENSORY: normal and symmetric to light touch, pinprick, temperature; VIB < 6 SEC AT TOES. COORDINATION: finger-nose-finger, fine finger movements normal REFLEXES: deep tendon reflexes present and symmetric; TRACE AT ANKLES GAIT/STATION: narrow based gait; DIFF WITH TANDEM. Romberg is negative   DIAGNOSTIC DATA (LABS, IMAGING, TESTING) - I reviewed patient records, labs, notes, testing and imaging myself where available.  No results found for this basename: WBC, HGB, HCT, MCV, PLT   No results found for this basename: na, k, cl, co2, glucose, bun, creatinine, calcium, prot, albumin, ast, alt, alkphos, bilitot, gfrnonaa, gfraa   No results found for this basename: CHOL, HDL, LDLCALC, LDLDIRECT, TRIG, CHOLHDL   No results found for this basename: HGBA1C   No results found for this basename: VITAMINB12   No results found for this basename: TSH     ASSESSMENT AND PLAN  73 y.o. year old male here with memory loss, cognitive decline; possibly over sevceral years, but worse in last 3-4 months. Previously living in [place], now lives with John Valdez and her family in [place] for past 1 year. Found to have low B12, now on replacement.  Ddx: moderate dementia (neurodegenerative vs vascular), metabolic, CNS inflamm, limited education  PLAN: - MRI brain - continue B12 replacement - monitor symptoms; if definite progression, then will start donepezil - no driving  Orders Placed This Encounter  Procedures  . MR Brain Wo Contrast   Return in about 4 months (around  09/21/2013).    John MarkerVIKRAM R. PENUMALLI, MD 05/21/2013, 11:05 AM Certified in Neurology, Neurophysiology and Neuroimaging  [facility] [address], [address] [place], [place] [address] [phone]

## 2013-06-06 ENCOUNTER — Ambulatory Visit
Admission: RE | Admit: 2013-06-06 | Discharge: 2013-06-06 | Disposition: A | Discharge status: Home / Self Care | Payer: Medicare Other | Source: Ambulatory Visit | Attending: Diagnostic Neuroimaging | Admitting: Diagnostic Neuroimaging

## 2013-06-06 DIAGNOSIS — R413 Other amnesia: Secondary | ICD-10-CM

## 2013-06-06 DIAGNOSIS — F0390 Unspecified dementia without behavioral disturbance: Secondary | ICD-10-CM

## 2013-06-06 DIAGNOSIS — F03B0 Unspecified dementia, moderate, without behavioral disturbance, psychotic disturbance, mood disturbance, and anxiety: Secondary | ICD-10-CM

## 2013-06-12 ENCOUNTER — Telehealth: Payer: Self-pay | Admitting: Diagnostic Neuroimaging

## 2013-06-12 NOTE — Telephone Encounter (Signed)
Patient's daughter calling for results of imaging study done last week. Please call to advise.

## 2013-06-12 NOTE — Telephone Encounter (Signed)
Pt calling requesting MRI results. Dr. Marjory Lies out of office, sending to [facility], Dr. Vickey Huger. Please advise

## 2013-06-12 NOTE — Telephone Encounter (Signed)
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  no stroke/ no tumour/ no MS .   CD

## 2013-06-13 NOTE — Telephone Encounter (Signed)
I called and relayed the  MRI results to pt and then to daughter Elonda Husky. She verbalized understanding. Released to mychart.

## 2013-06-13 NOTE — Telephone Encounter (Signed)
Pt's information was given to Sandy, RN. Please advise °

## 2013-09-25 ENCOUNTER — Ambulatory Visit: Payer: Medicare Other | Admitting: Diagnostic Neuroimaging

## 2013-10-01 ENCOUNTER — Encounter: Payer: Self-pay | Admitting: Diagnostic Neuroimaging

## 2013-10-17 DIAGNOSIS — E119 Type 2 diabetes mellitus without complications: Secondary | ICD-10-CM | POA: Diagnosis not present

## 2013-10-17 DIAGNOSIS — I10 Essential (primary) hypertension: Secondary | ICD-10-CM | POA: Diagnosis not present

## 2013-10-17 DIAGNOSIS — Z23 Encounter for immunization: Secondary | ICD-10-CM | POA: Diagnosis not present

## 2013-10-17 DIAGNOSIS — R413 Other amnesia: Secondary | ICD-10-CM | POA: Diagnosis not present

## 2013-10-17 DIAGNOSIS — E538 Deficiency of other specified B group vitamins: Secondary | ICD-10-CM | POA: Diagnosis not present

## 2013-10-21 DIAGNOSIS — Z111 Encounter for screening for respiratory tuberculosis: Secondary | ICD-10-CM | POA: Diagnosis not present

## 2013-11-05 DIAGNOSIS — S139XXA Sprain of joints and ligaments of unspecified parts of neck, initial encounter: Secondary | ICD-10-CM | POA: Diagnosis not present

## 2013-11-05 DIAGNOSIS — M7552 Bursitis of left shoulder: Secondary | ICD-10-CM | POA: Diagnosis not present

## 2013-11-16 IMAGING — CR DG LUMBAR SPINE 2-3V
3 series · 3 of 3 positions shown · non-contrast
Comparison: None.

CLINICAL DATA: Low back pain. No injury.

EXAM:
LUMBAR SPINE - 2-3 VIEW

[t l-spine a.p.]
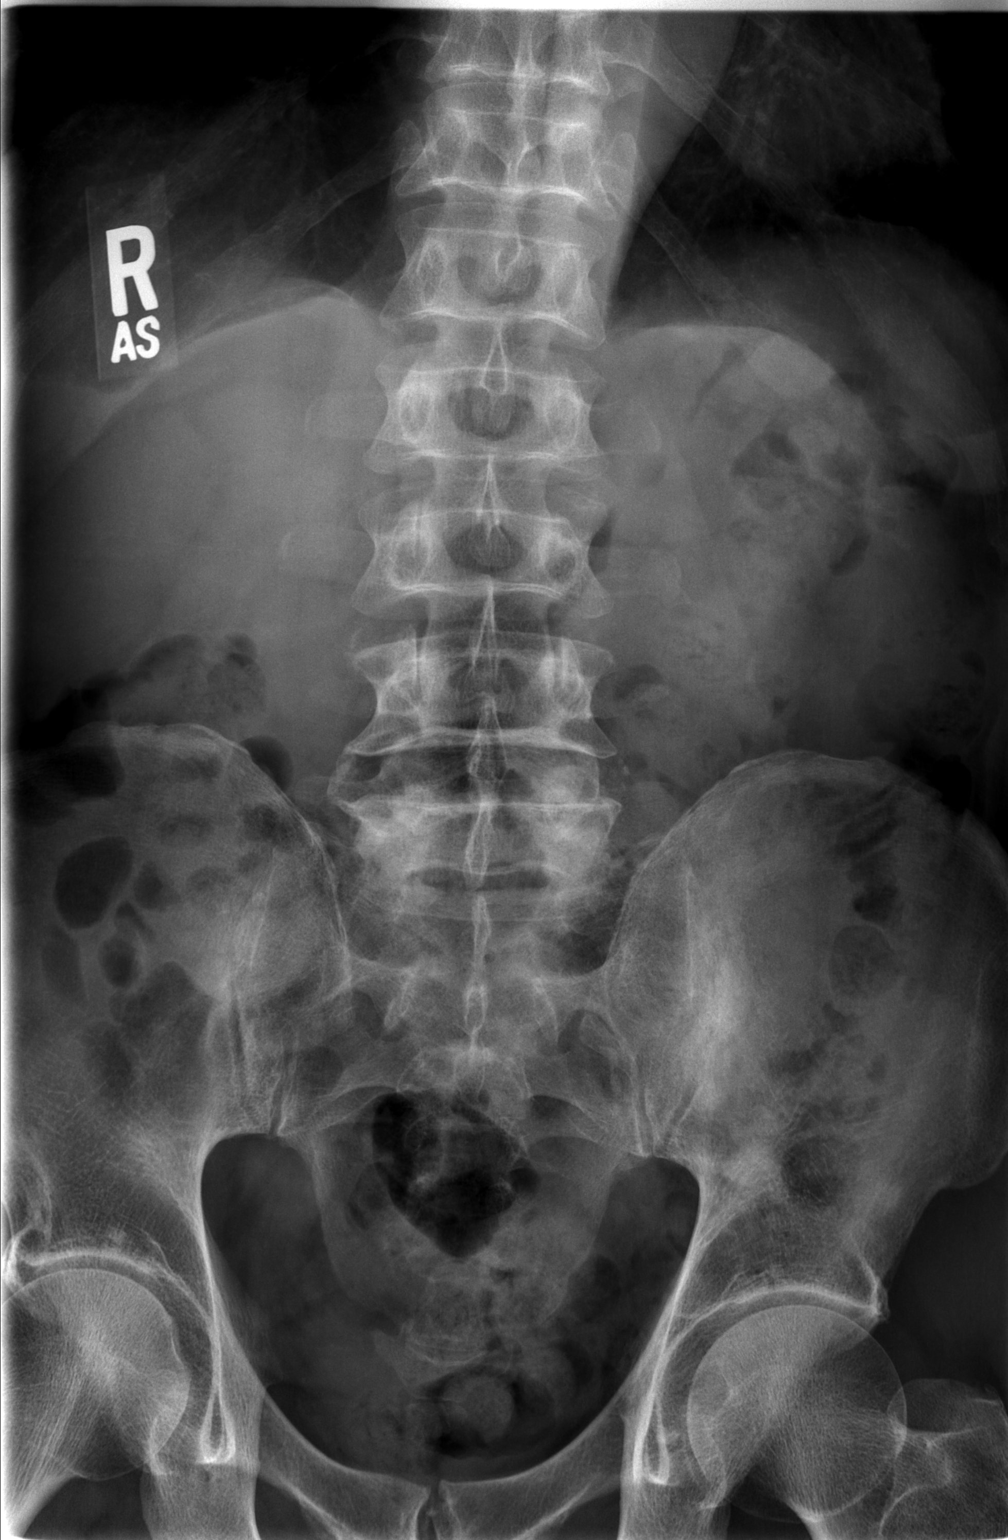

[t l-spine lat]
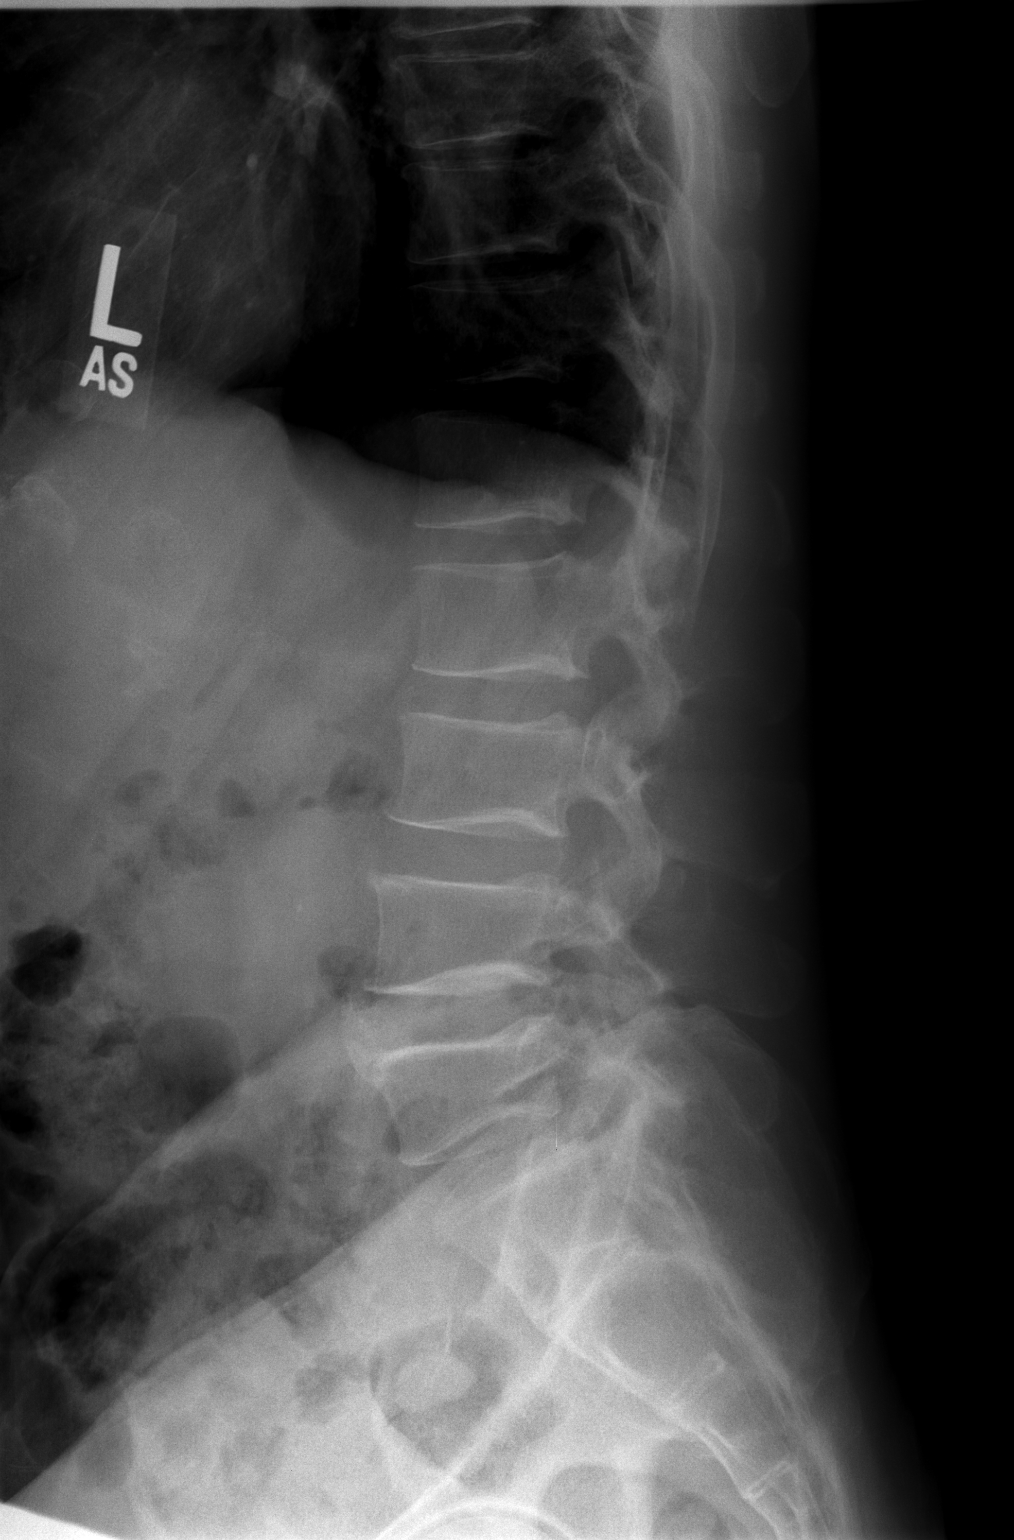

[t l-spine l5-s1 spot]
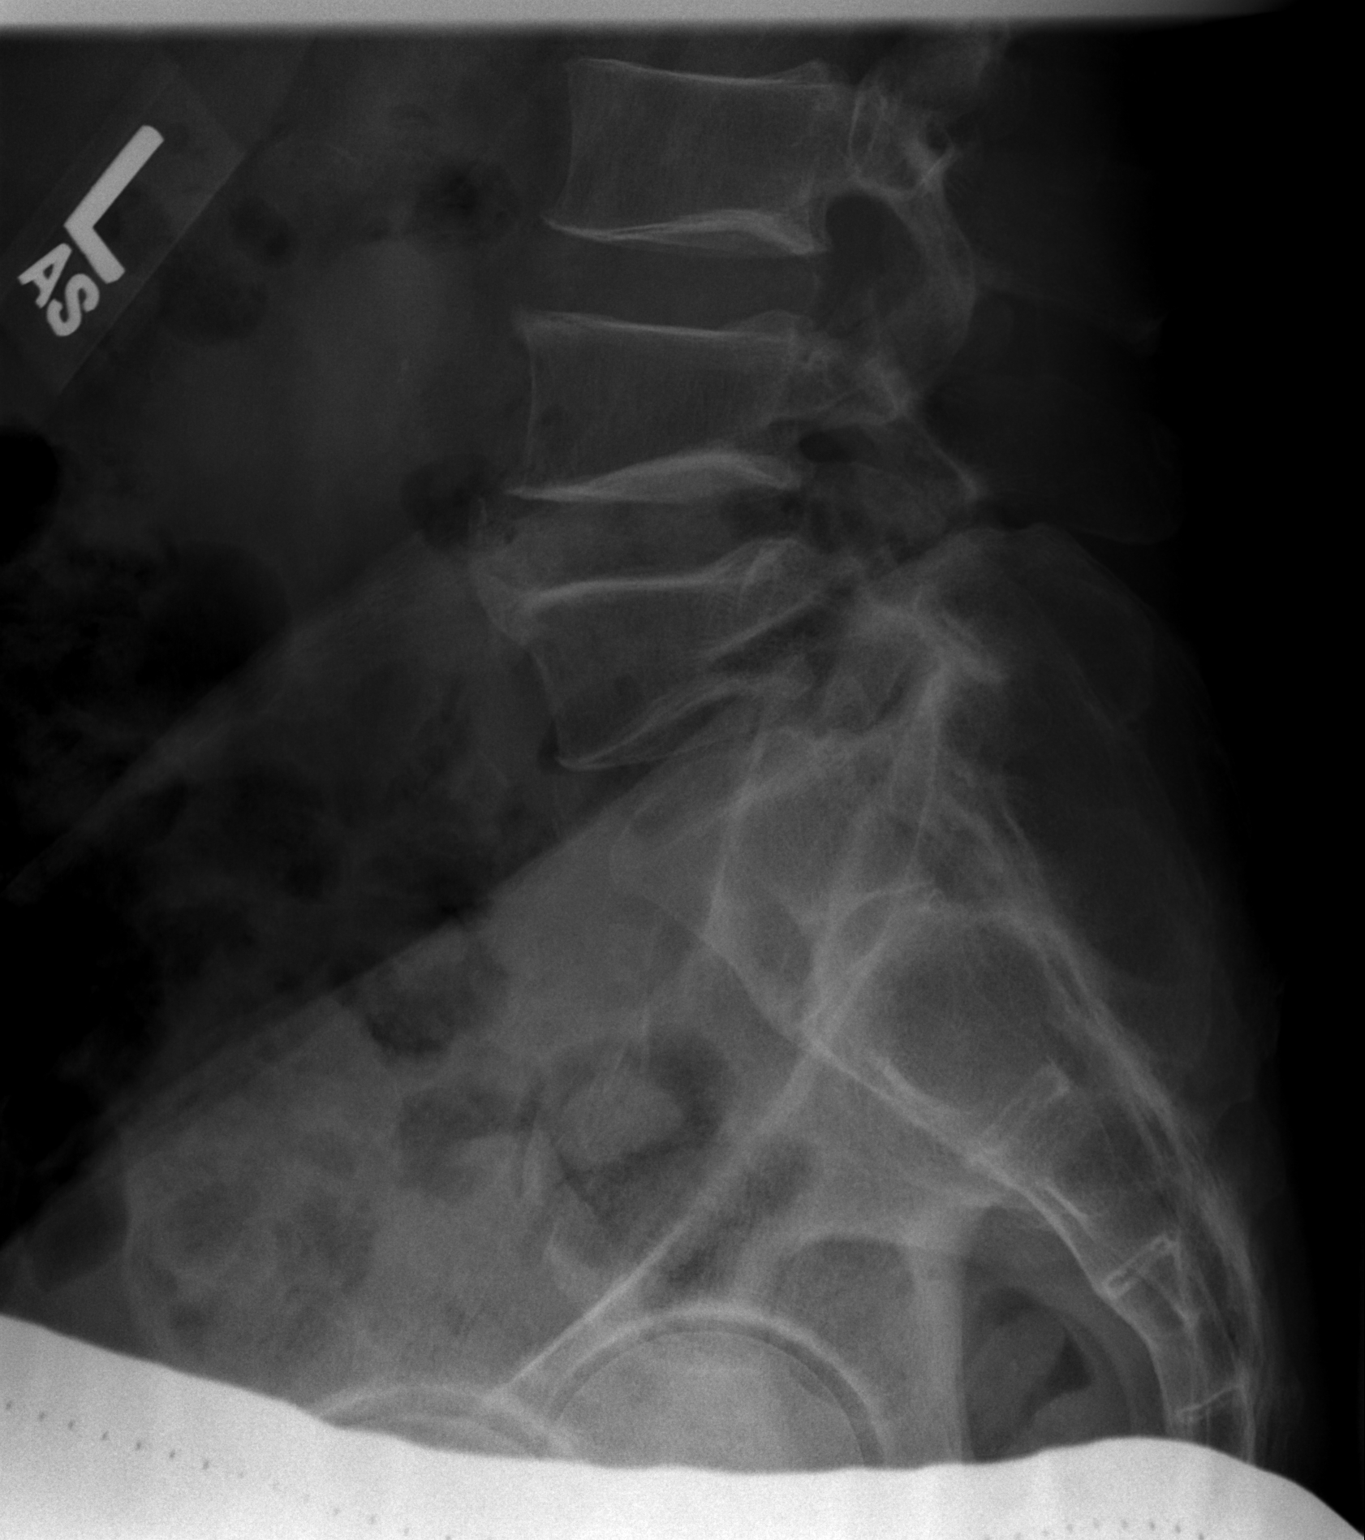

[3 of 3 positions shown; findings below may reference images not displayed]

FINDINGS: No fracture. No spondylolisthesis. The disc spaces are relatively
well preserved. There are small endplate osteophytes at L3-L4 and
L4-L5.

The bones are demineralized.  The soft tissues are unremarkable.
IMPRESSION: No fracture or acute finding.

Minor degenerative changes

## 2013-11-16 IMAGING — CR DG HIP (WITH OR WITHOUT PELVIS) 2-3V*L*
2 series · 2 of 2 positions shown · non-contrast
Comparison: None.

CLINICAL DATA: Left hip pain. No injury.

EXAM:
LEFT HIP - COMPLETE 2+ VIEW

[t hip ap left]
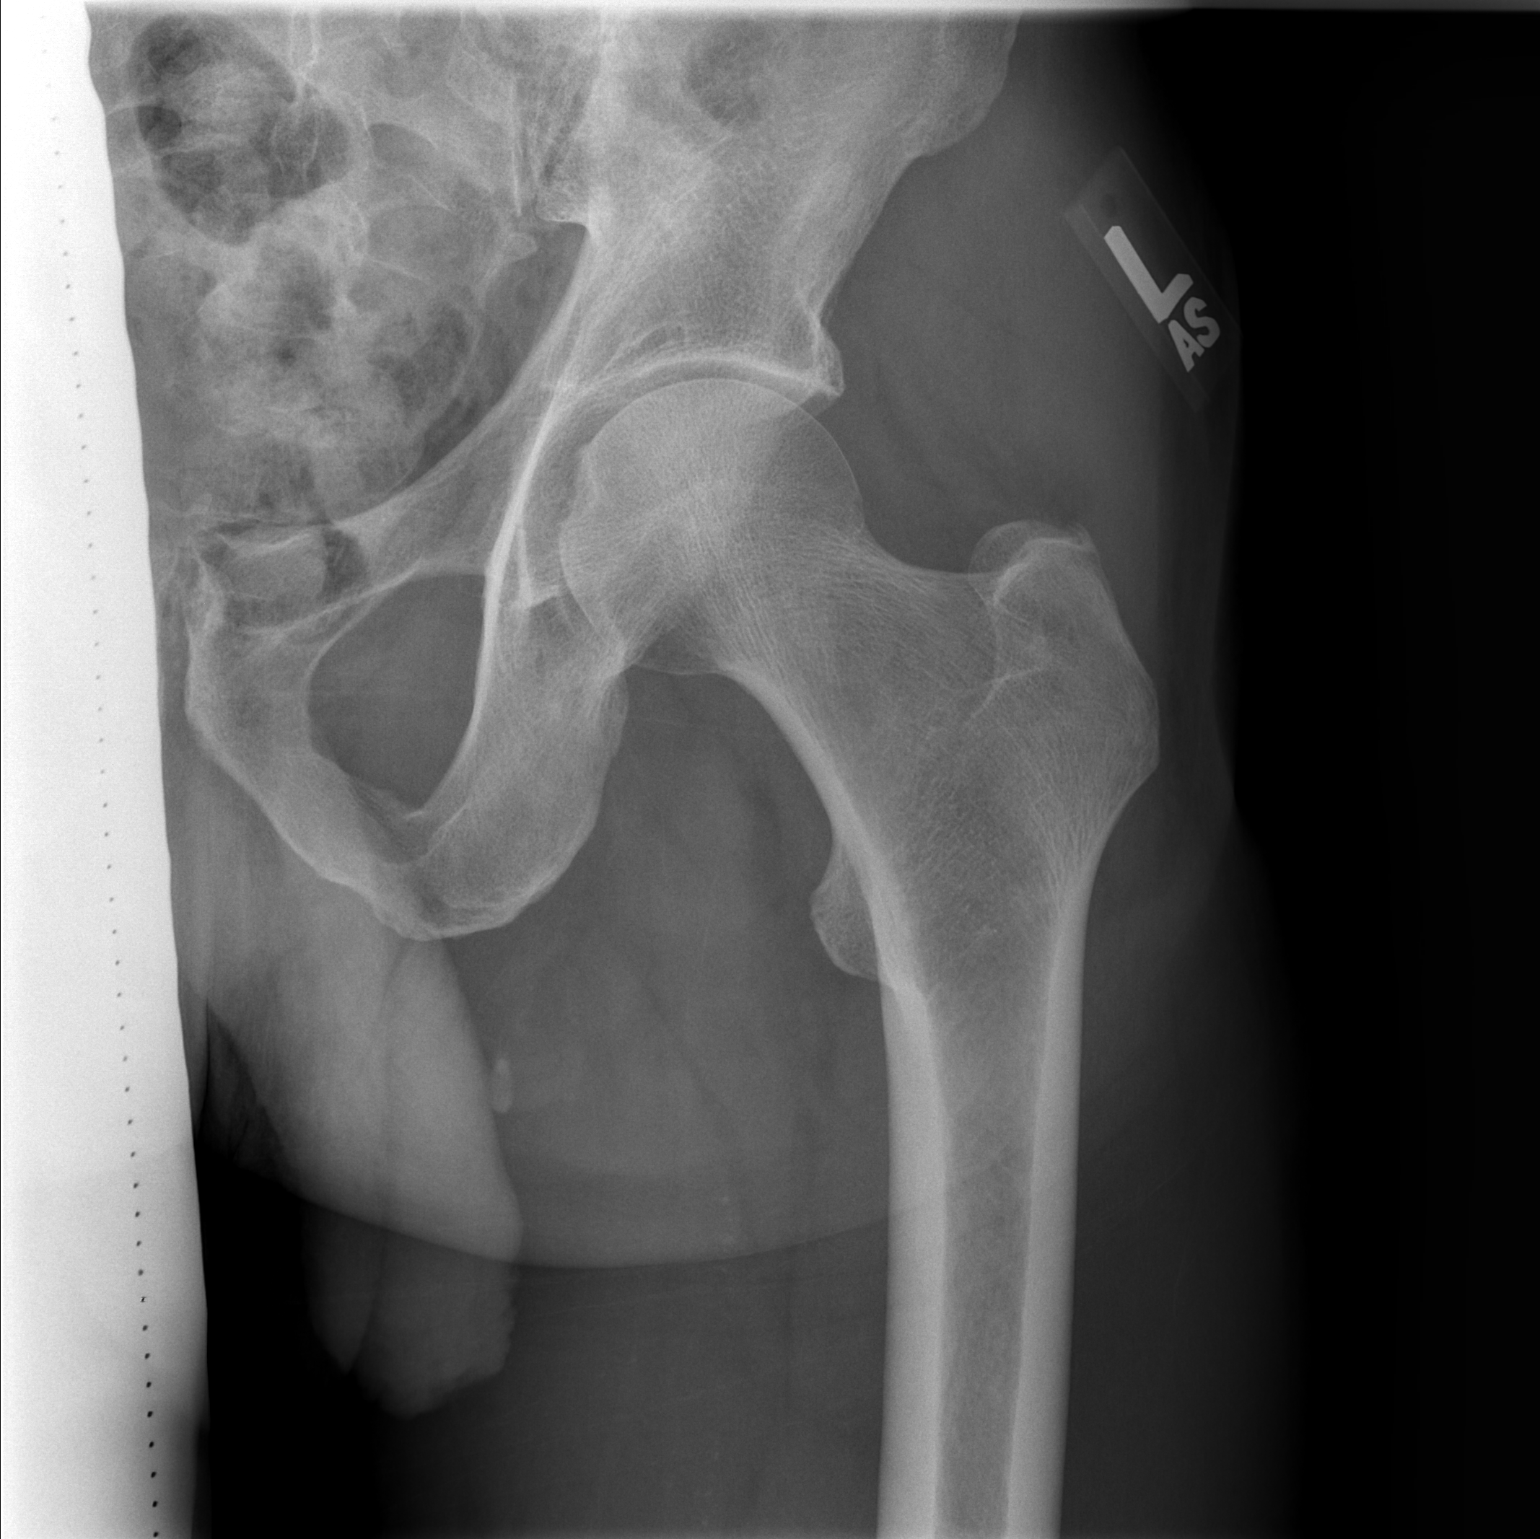

[t hip frog leg left]
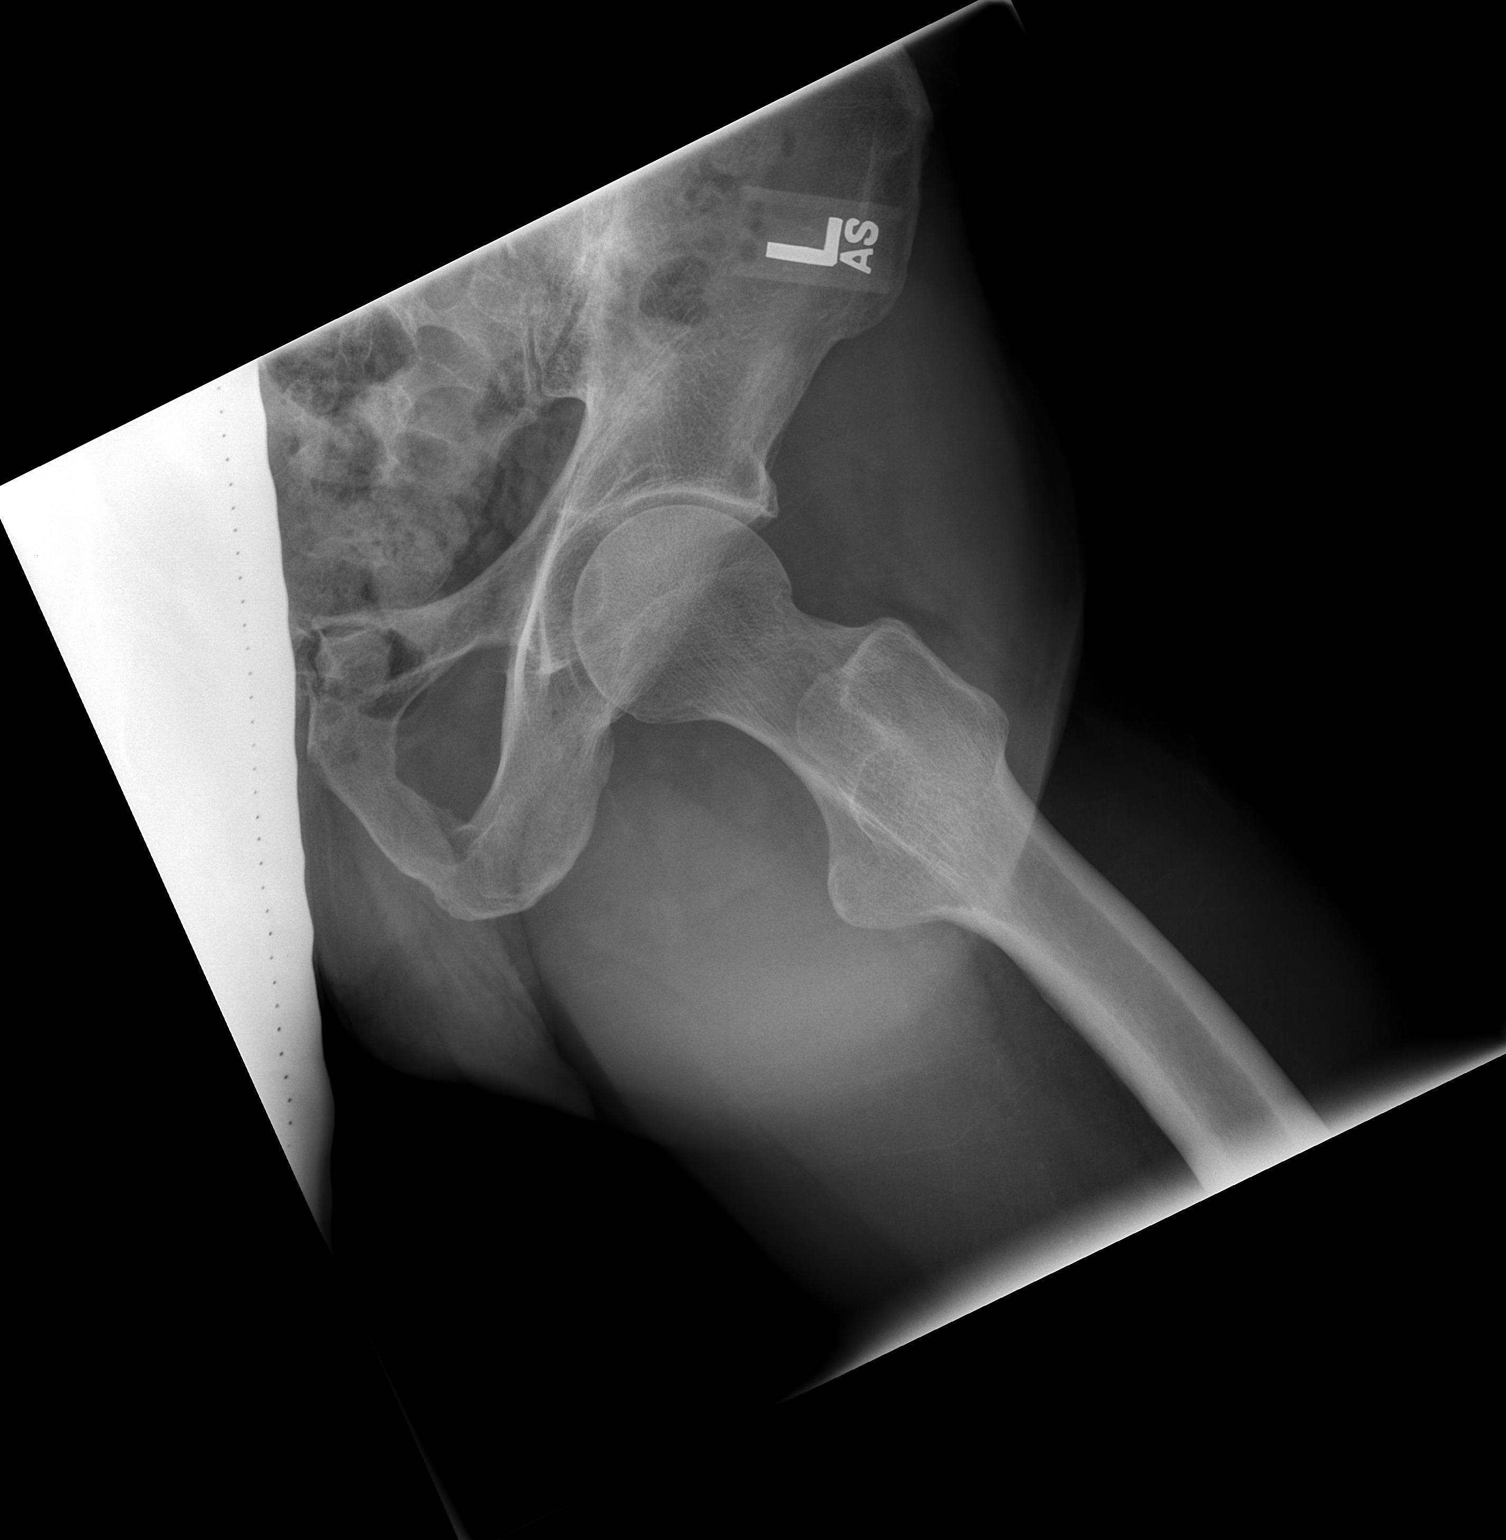

[2 of 2 positions shown; findings below may reference images not displayed]

FINDINGS: There is no evidence of hip fracture or dislocation. There is no
evidence of arthropathy or other focal bone abnormality.
IMPRESSION: Negative.

## 2014-01-29 DIAGNOSIS — M542 Cervicalgia: Secondary | ICD-10-CM | POA: Diagnosis not present

## 2014-01-29 DIAGNOSIS — M7552 Bursitis of left shoulder: Secondary | ICD-10-CM | POA: Diagnosis not present

## 2014-02-04 DIAGNOSIS — M542 Cervicalgia: Secondary | ICD-10-CM | POA: Diagnosis not present

## 2014-02-18 DIAGNOSIS — E785 Hyperlipidemia, unspecified: Secondary | ICD-10-CM | POA: Diagnosis not present

## 2014-02-18 DIAGNOSIS — R413 Other amnesia: Secondary | ICD-10-CM | POA: Diagnosis not present

## 2014-02-18 DIAGNOSIS — E139 Other specified diabetes mellitus without complications: Secondary | ICD-10-CM | POA: Diagnosis not present

## 2014-02-18 DIAGNOSIS — I10 Essential (primary) hypertension: Secondary | ICD-10-CM | POA: Diagnosis not present

## 2014-07-22 DIAGNOSIS — E785 Hyperlipidemia, unspecified: Secondary | ICD-10-CM | POA: Diagnosis not present

## 2014-07-22 DIAGNOSIS — E139 Other specified diabetes mellitus without complications: Secondary | ICD-10-CM | POA: Diagnosis not present

## 2014-07-22 DIAGNOSIS — R413 Other amnesia: Secondary | ICD-10-CM | POA: Diagnosis not present

## 2014-07-22 DIAGNOSIS — E538 Deficiency of other specified B group vitamins: Secondary | ICD-10-CM | POA: Diagnosis not present

## 2014-07-22 DIAGNOSIS — I10 Essential (primary) hypertension: Secondary | ICD-10-CM | POA: Diagnosis not present

## 2014-10-29 DIAGNOSIS — Z23 Encounter for immunization: Secondary | ICD-10-CM | POA: Diagnosis not present

## 2014-10-29 DIAGNOSIS — R21 Rash and other nonspecific skin eruption: Secondary | ICD-10-CM | POA: Diagnosis not present

## 2014-10-29 DIAGNOSIS — R05 Cough: Secondary | ICD-10-CM | POA: Diagnosis not present

## 2014-10-29 DIAGNOSIS — I10 Essential (primary) hypertension: Secondary | ICD-10-CM | POA: Diagnosis not present

## 2015-01-10 DIAGNOSIS — R5383 Other fatigue: Secondary | ICD-10-CM | POA: Diagnosis not present

## 2015-01-10 DIAGNOSIS — I10 Essential (primary) hypertension: Secondary | ICD-10-CM | POA: Diagnosis not present

## 2015-01-23 ENCOUNTER — Other Ambulatory Visit: Payer: Self-pay | Admitting: Internal Medicine

## 2015-01-23 ENCOUNTER — Ambulatory Visit
Admission: RE | Admit: 2015-01-23 | Discharge: 2015-01-23 | Disposition: A | Discharge status: Home / Self Care | Payer: Medicare Other | Source: Ambulatory Visit | Attending: Internal Medicine | Admitting: Internal Medicine

## 2015-01-23 DIAGNOSIS — I10 Essential (primary) hypertension: Secondary | ICD-10-CM | POA: Diagnosis not present

## 2015-01-23 DIAGNOSIS — Z0001 Encounter for general adult medical examination with abnormal findings: Secondary | ICD-10-CM | POA: Diagnosis not present

## 2015-01-23 DIAGNOSIS — R0602 Shortness of breath: Secondary | ICD-10-CM

## 2015-01-23 DIAGNOSIS — Z125 Encounter for screening for malignant neoplasm of prostate: Secondary | ICD-10-CM | POA: Diagnosis not present

## 2015-01-23 DIAGNOSIS — E119 Type 2 diabetes mellitus without complications: Secondary | ICD-10-CM | POA: Diagnosis not present

## 2015-01-23 DIAGNOSIS — R413 Other amnesia: Secondary | ICD-10-CM | POA: Diagnosis not present

## 2015-01-23 DIAGNOSIS — Z1389 Encounter for screening for other disorder: Secondary | ICD-10-CM | POA: Diagnosis not present

## 2015-01-23 DIAGNOSIS — E538 Deficiency of other specified B group vitamins: Secondary | ICD-10-CM | POA: Diagnosis not present

## 2015-01-23 DIAGNOSIS — E785 Hyperlipidemia, unspecified: Secondary | ICD-10-CM | POA: Diagnosis not present

## 2015-03-04 DIAGNOSIS — R05 Cough: Secondary | ICD-10-CM | POA: Diagnosis not present

## 2015-03-09 DIAGNOSIS — H2513 Age-related nuclear cataract, bilateral: Secondary | ICD-10-CM | POA: Diagnosis not present

## 2015-03-10 DIAGNOSIS — H903 Sensorineural hearing loss, bilateral: Secondary | ICD-10-CM | POA: Diagnosis not present

## 2015-03-20 DIAGNOSIS — H903 Sensorineural hearing loss, bilateral: Secondary | ICD-10-CM | POA: Diagnosis not present

## 2015-04-06 DIAGNOSIS — Z1211 Encounter for screening for malignant neoplasm of colon: Secondary | ICD-10-CM | POA: Diagnosis not present

## 2015-04-17 DIAGNOSIS — W108XXA Fall (on) (from) other stairs and steps, initial encounter: Secondary | ICD-10-CM | POA: Diagnosis not present

## 2015-04-17 DIAGNOSIS — S82821A Torus fracture of lower end of right fibula, initial encounter for closed fracture: Secondary | ICD-10-CM | POA: Diagnosis not present

## 2015-04-17 DIAGNOSIS — S93401A Sprain of unspecified ligament of right ankle, initial encounter: Secondary | ICD-10-CM | POA: Diagnosis not present

## 2015-04-18 DIAGNOSIS — S82831A Other fracture of upper and lower end of right fibula, initial encounter for closed fracture: Secondary | ICD-10-CM | POA: Diagnosis not present

## 2015-04-22 DIAGNOSIS — S82831D Other fracture of upper and lower end of right fibula, subsequent encounter for closed fracture with routine healing: Secondary | ICD-10-CM | POA: Diagnosis not present

## 2015-05-04 DIAGNOSIS — S82831D Other fracture of upper and lower end of right fibula, subsequent encounter for closed fracture with routine healing: Secondary | ICD-10-CM | POA: Diagnosis not present

## 2015-05-27 DIAGNOSIS — S82831D Other fracture of upper and lower end of right fibula, subsequent encounter for closed fracture with routine healing: Secondary | ICD-10-CM | POA: Diagnosis not present

## 2015-06-17 DIAGNOSIS — S82831D Other fracture of upper and lower end of right fibula, subsequent encounter for closed fracture with routine healing: Secondary | ICD-10-CM | POA: Diagnosis not present

## 2015-07-08 DIAGNOSIS — S82831D Other fracture of upper and lower end of right fibula, subsequent encounter for closed fracture with routine healing: Secondary | ICD-10-CM | POA: Diagnosis not present

## 2015-08-19 DIAGNOSIS — S82831D Other fracture of upper and lower end of right fibula, subsequent encounter for closed fracture with routine healing: Secondary | ICD-10-CM | POA: Diagnosis not present

## 2015-08-25 DIAGNOSIS — R413 Other amnesia: Secondary | ICD-10-CM | POA: Diagnosis not present

## 2015-08-25 DIAGNOSIS — I10 Essential (primary) hypertension: Secondary | ICD-10-CM | POA: Diagnosis not present

## 2015-08-25 DIAGNOSIS — E119 Type 2 diabetes mellitus without complications: Secondary | ICD-10-CM | POA: Diagnosis not present

## 2015-09-30 DIAGNOSIS — S82831D Other fracture of upper and lower end of right fibula, subsequent encounter for closed fracture with routine healing: Secondary | ICD-10-CM | POA: Diagnosis not present

## 2015-10-27 DIAGNOSIS — S82831D Other fracture of upper and lower end of right fibula, subsequent encounter for closed fracture with routine healing: Secondary | ICD-10-CM | POA: Diagnosis not present

## 2015-11-03 DIAGNOSIS — R05 Cough: Secondary | ICD-10-CM | POA: Diagnosis not present

## 2015-11-24 ENCOUNTER — Ambulatory Visit (INDEPENDENT_AMBULATORY_CARE_PROVIDER_SITE_OTHER): Payer: Medicare Other | Admitting: Pulmonary Disease

## 2015-11-24 ENCOUNTER — Ambulatory Visit (INDEPENDENT_AMBULATORY_CARE_PROVIDER_SITE_OTHER)
Admission: RE | Admit: 2015-11-24 | Discharge: 2015-11-24 | Disposition: A | Discharge status: Home / Self Care | Payer: Medicare Other | Source: Ambulatory Visit | Attending: Pulmonary Disease | Admitting: Pulmonary Disease

## 2015-11-24 ENCOUNTER — Encounter: Payer: Self-pay | Admitting: Pulmonary Disease

## 2015-11-24 VITALS — BP 136/74 | HR 55 | Ht 66.0 in | Wt 172.0 lb

## 2015-11-24 DIAGNOSIS — R0602 Shortness of breath: Secondary | ICD-10-CM | POA: Diagnosis not present

## 2015-11-24 DIAGNOSIS — R0600 Dyspnea, unspecified: Secondary | ICD-10-CM

## 2015-11-24 DIAGNOSIS — R059 Cough, unspecified: Secondary | ICD-10-CM

## 2015-11-24 DIAGNOSIS — R0689 Other abnormalities of breathing: Secondary | ICD-10-CM

## 2015-11-24 DIAGNOSIS — R05 Cough: Secondary | ICD-10-CM

## 2015-11-24 MED ORDER — AZELASTINE-FLUTICASONE 137-50 MCG/ACT NA SUSP: 2.0000 | Freq: Every day | NASAL | 3 refills | Status: DC

## 2015-11-24 MED ORDER — CHLORPHENIRAMINE MALEATE 4 MG PO TABS: 8.0000 mg | ORAL_TABLET | Freq: Three times a day (TID) | ORAL | 3 refills | Status: DC

## 2015-11-24 MED ORDER — AZELASTINE-FLUTICASONE 137-50 MCG/ACT NA SUSP: 2.0000 | Freq: Every day | NASAL | 0 refills | Status: DC

## 2015-11-24 MED ORDER — FLUTICASONE-SALMETEROL 100-50 MCG/DOSE IN AEPB: 1.0000 | INHALATION_SPRAY | Freq: Two times a day (BID) | RESPIRATORY_TRACT | 0 refills | Status: DC

## 2015-11-24 NOTE — Progress Notes (Signed)
John Valdez    [id number]    12-31-1940  Primary Care Physician:POLITE,RONALD D, MD  Referring Physician: Renford Dillsonald Polite, MD [address] [place], [place] [address]  Chief complaint:  Consult for cough, congestion.  HPI: John Valdez is a 75 year old with past medical history of hypertension, dementia, hearing loss. He moved here from [place] years ago to be closer to his family. He complains of chronic cough for the past 8 months with chest congestion, yellow mucus production. He has dyspnea with exertion, not at rest. No associated wheezing, fevers, chills. He was on inhalers more than 3 years ago but does not recall the name.  His primary care physician started him on Zyrtec for allergic rhinitis, postnasal drip a few weeks ago. He has not noticed any difference in symptoms. He was also given an albuterol inhaler but has not filled the prescription yet. He has occasional seasonal allergies, denies any GERD, heartburn. An extensive smoking history, quit in 1997. 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 He does not report any asbestos or other relevant exposures.  Outpatient Encounter Prescriptions as of 11/24/2015  Medication Sig  . aspirin (BAYER LOW DOSE) 81 MG EC tablet Take 81 mg by mouth daily. Swallow whole.  . betamethasone dipropionate (DIPROLENE) 0.05 % cream Apply topically daily as needed.  . cetirizine (ZYRTEC) 10 MG tablet Take 10 mg by mouth daily.  Marland Kitchen. donepezil (ARICEPT) 5 MG tablet Take 5 mg by mouth at bedtime.  Marland Kitchen. losartan (COZAAR) 50 MG tablet Take 50 mg by mouth daily.  . meloxicam (MOBIC) 7.5 MG tablet Take 7.5 mg by mouth daily as needed for pain.  . vitamin B-12 (CYANOCOBALAMIN) 1000 MCG tablet Take 1,000 mcg by mouth daily.  . [DISCONTINUED] olmesartan (BENICAR) 20 MG tablet Take 10 mg by mouth daily.   No facility-administered encounter medications on file as of 11/24/2015.     Allergies as of 11/24/2015 -  Review Complete 11/24/2015  Allergen Reaction Noted  . Other  05/21/2013    Past Medical History:  Diagnosis Date  . B12 deficiency   . BPH (benign prostatic hyperplasia)   . DM (diabetes mellitus) (HCC)   . HTN (hypertension)     Past Surgical History:  Procedure Laterality Date  . SHOULDER SURGERY Left 2000    Family History  Problem Relation Age of Onset  . Aneurysm Mother   . Colon cancer Father     Social History   Social History  . Marital status: Married    Spouse name: John Valdez  . Number of children: 4  . Years of education: N/A   Occupational History  . Retired    Social History Main Topics  . Smoking status: Former Smoker    Packs/day: 2.00    Years: 25.00    Types: Cigars, Cigarettes    Quit date: 01/11/1995  . Smokeless tobacco: Never Used     Comment: smoked cigarettes x8996yrs, then smoked cigars  . Alcohol use No     Comment: quit 30 yrs ago  . Drug use: No  . Sexual activity: Not on file   Other Topics Concern  . Not on file   Social History Narrative   Widowed   Lives with daughter   4 children   Caffeine use: 2 cups daily   Travels to [place] frequently (every couple of months) for about 1 week at a time  OCCUPATION: custodial work for schools     Review of systems: Review of Systems  Constitutional: Negative for fever and chills.  HENT: Negative.   Eyes: Negative for blurred vision.  Respiratory: as per HPI  Cardiovascular: Negative for chest pain and palpitations.  Gastrointestinal: Negative for vomiting, diarrhea, blood per rectum. Genitourinary: Negative for dysuria, urgency, frequency and hematuria.  Musculoskeletal: Negative for myalgias, back pain and joint pain.  Skin: Negative for itching and rash.  Neurological: Negative for dizziness, tremors, focal weakness, seizures and loss of consciousness.  Endo/Heme/Allergies: Negative for environmental allergies.  Psychiatric/Behavioral: Negative for depression, suicidal ideas and  hallucinations.  All other systems reviewed and are negative.   Physical Exam: Blood pressure 136/74, pulse (!) 55, height 5\' 6"  (1.676 m), weight 172 lb (78 kg), SpO2 98 %. Gen:      No acute distress HEENT:  EOMI, sclera anicteric Neck:     No masses; no thyromegaly Lungs:    Clear to auscultation bilaterally; normal respiratory effort CV:         Regular rate and rhythm; no murmurs Abd:      + bowel sounds; soft, non-tender; no palpable masses, no distension Ext:    No edema; adequate peripheral perfusion Skin:      Warm and dry; no rash Neuro: alert and oriented x 3 Psych: normal mood and affect  Data Reviewed: CXR 01/23/15-hyperinflation, no acute cardiac pulmonary and normal. Images personally reviewed.  Assessment:  Assessment for chronic cough, congestion.  John Valdez may have COPD based on his smoking history and symptoms. Other considerations include upper airway cough syndrome from GERD, allergic rhinitis. He has been started on Zyrtec but his symptoms persist. We will switch this to a stronger antihistamine chlorphentermine 8 mg 3 times a day and dymista nasal spray. He has been given an albuterol inhaler but is not using it yet. I'll get a chest x-ray, pulmonary function tests and in the meantime give him some samples of Advair to try out. He'll return in 1 month to review symptoms and plan for further workup and treatment as needed. I'll hold off on treating his GERD for now.  He has a heavy smoking history but is not a candidate for screening CTs as he quit over 15 yrs ago.   Plan/Recommendations: - PFTs, CXR - Samples of advair 100/50 - Chlorpheniramine 8 mg tid and dymista nasal spray   Chilton GreathousePraveen Aahna Rossa MD [facility] Pager [phone] 11/24/2015, 10:23 AM  CC: Renford DillsPolite, Ronald, MD

## 2015-11-24 NOTE — Patient Instructions (Signed)
We will schedule you for a chest x-ray and pulmonary function tests. Stop taking the Zyrtec. We'll start on chlorphentermine 8 mg 3 times daily and dymista nasal spray. We will give a sample of Advair 100/50. Use this twice daily.  Return to clinic in 1-2 months to review results and response to therapy.

## 2015-11-27 NOTE — Progress Notes (Signed)
ATC pt x2 - someone picked up the line but no one answered.  WCB.

## 2015-12-11 ENCOUNTER — Encounter: Payer: Self-pay | Admitting: *Deleted

## 2015-12-11 NOTE — Progress Notes (Signed)
My chart message sent to pt.

## 2015-12-15 ENCOUNTER — Other Ambulatory Visit: Payer: Self-pay

## 2015-12-15 ENCOUNTER — Telehealth: Payer: Self-pay | Admitting: Pulmonary Disease

## 2015-12-15 NOTE — Telephone Encounter (Signed)
[facility] [address]   Elwin SleightDulera is not covered by insurance, no covered alternatives.  Insurance will cover fluticasone and astelin as two separate nasal sprays.    PM please advise if we need to proceed with PA, or please provide alternative.  Thanks!

## 2015-12-16 MED ORDER — FLUTICASONE PROPIONATE 50 MCG/ACT NA SUSP: 2.0000 | Freq: Every day | NASAL | 5 refills | Status: DC

## 2015-12-16 MED ORDER — AZELASTINE HCL 0.1 % NA SOLN: 2.0000 | Freq: Every day | NASAL | 5 refills | Status: DC

## 2015-12-16 NOTE — Telephone Encounter (Signed)
Rxs have been sent in per PM. Nothing further was needed.

## 2015-12-16 NOTE — Telephone Encounter (Signed)
Is it Dymista that is not covered by insurance?  OK to use fluticasone and astelin as two separate nasal sprays. Please order.

## 2016-01-01 ENCOUNTER — Ambulatory Visit (INDEPENDENT_AMBULATORY_CARE_PROVIDER_SITE_OTHER): Payer: Medicare Other | Admitting: Pulmonary Disease

## 2016-01-01 DIAGNOSIS — R0689 Other abnormalities of breathing: Secondary | ICD-10-CM | POA: Diagnosis not present

## 2016-01-01 DIAGNOSIS — R05 Cough: Secondary | ICD-10-CM

## 2016-01-01 DIAGNOSIS — R059 Cough, unspecified: Secondary | ICD-10-CM

## 2016-01-01 DIAGNOSIS — R0600 Dyspnea, unspecified: Secondary | ICD-10-CM | POA: Diagnosis not present

## 2016-01-01 LAB — PULMONARY FUNCTION TEST
DL/VA % pred: 116 %
DL/VA: 5.27 ml/min/mmHg/L
DLCO COR % PRED: 87 %
DLCO COR: 27.22 ml/min/mmHg
DLCO unc % pred: 86 %
DLCO unc: 26.66 ml/min/mmHg
FEF 25-75 Post: 1.98 L/sec
FEF 25-75 Pre: 1.78 L/sec
FEF2575-%Change-Post: 11 %
FEF2575-%PRED-POST: 91 %
FEF2575-%PRED-PRE: 82 %
FEV1-%CHANGE-POST: 2 %
FEV1-%PRED-POST: 90 %
FEV1-%Pred-Pre: 88 %
FEV1-POST: 2.4 L
FEV1-Pre: 2.33 L
FEV1FVC-%CHANGE-POST: 0 %
FEV1FVC-%PRED-PRE: 99 %
FEV6-%Change-Post: 2 %
FEV6-%Pred-Post: 92 %
FEV6-%Pred-Pre: 89 %
FEV6-Post: 3.14 L
FEV6-Pre: 3.05 L
FEV6FVC-%Change-Post: 0 %
FEV6FVC-%Pred-Post: 102 %
FEV6FVC-%Pred-Pre: 103 %
FVC-%CHANGE-POST: 2 %
FVC-%PRED-POST: 89 %
FVC-%PRED-PRE: 86 %
FVC-POST: 3.21 L
FVC-PRE: 3.12 L
POST FEV1/FVC RATIO: 75 %
PRE FEV1/FVC RATIO: 75 %
PRE FEV6/FVC RATIO: 98 %
Post FEV6/FVC ratio: 98 %
RV % pred: 67 %
RV: 1.69 L
TLC % PRED: 88 %
TLC: 6.04 L

## 2016-01-12 DIAGNOSIS — M25531 Pain in right wrist: Secondary | ICD-10-CM | POA: Diagnosis not present

## 2016-01-28 ENCOUNTER — Ambulatory Visit: Payer: PRIVATE HEALTH INSURANCE | Admitting: Pulmonary Disease

## 2016-02-03 ENCOUNTER — Telehealth: Payer: Self-pay | Admitting: Pulmonary Disease

## 2016-02-03 DIAGNOSIS — R413 Other amnesia: Secondary | ICD-10-CM | POA: Diagnosis not present

## 2016-02-03 DIAGNOSIS — E538 Deficiency of other specified B group vitamins: Secondary | ICD-10-CM | POA: Diagnosis not present

## 2016-02-03 DIAGNOSIS — Z23 Encounter for immunization: Secondary | ICD-10-CM | POA: Diagnosis not present

## 2016-02-03 DIAGNOSIS — Z0000 Encounter for general adult medical examination without abnormal findings: Secondary | ICD-10-CM | POA: Diagnosis not present

## 2016-02-03 DIAGNOSIS — Z1389 Encounter for screening for other disorder: Secondary | ICD-10-CM | POA: Diagnosis not present

## 2016-02-03 DIAGNOSIS — I10 Essential (primary) hypertension: Secondary | ICD-10-CM | POA: Diagnosis not present

## 2016-02-03 DIAGNOSIS — E119 Type 2 diabetes mellitus without complications: Secondary | ICD-10-CM | POA: Diagnosis not present

## 2016-02-03 MED ORDER — FLUTICASONE PROPIONATE 50 MCG/ACT NA SUSP: 2.0000 | Freq: Every day | NASAL | 5 refills | Status: DC

## 2016-02-03 MED ORDER — FLUTICASONE-SALMETEROL 100-50 MCG/DOSE IN AEPB: 1.0000 | INHALATION_SPRAY | Freq: Two times a day (BID) | RESPIRATORY_TRACT | 5 refills | Status: AC

## 2016-02-03 NOTE — Telephone Encounter (Signed)
Rx sent to preferred pharmacy. Pt's daughter is made aware and voiced her understanding. Nothing further needed.

## 2016-02-08 DIAGNOSIS — M7661 Achilles tendinitis, right leg: Secondary | ICD-10-CM | POA: Diagnosis not present

## 2016-02-18 ENCOUNTER — Ambulatory Visit (INDEPENDENT_AMBULATORY_CARE_PROVIDER_SITE_OTHER): Payer: Medicare Other | Admitting: Pulmonary Disease

## 2016-02-18 ENCOUNTER — Encounter: Payer: Self-pay | Admitting: Pulmonary Disease

## 2016-02-18 VITALS — BP 124/68 | HR 65 | Ht 66.0 in | Wt 176.0 lb

## 2016-02-18 DIAGNOSIS — R05 Cough: Secondary | ICD-10-CM

## 2016-02-18 DIAGNOSIS — R0600 Dyspnea, unspecified: Secondary | ICD-10-CM | POA: Diagnosis not present

## 2016-02-18 DIAGNOSIS — R0689 Other abnormalities of breathing: Secondary | ICD-10-CM | POA: Diagnosis not present

## 2016-02-18 DIAGNOSIS — R059 Cough, unspecified: Secondary | ICD-10-CM

## 2016-02-18 MED ORDER — ALBUTEROL SULFATE HFA 108 (90 BASE) MCG/ACT IN AERS: 2.0000 | INHALATION_SPRAY | Freq: Four times a day (QID) | RESPIRATORY_TRACT | 2 refills | Status: DC | PRN

## 2016-02-18 NOTE — Patient Instructions (Signed)
Continue using the nasal sprays as prescribed If it's okay to stop the Advair. We will give you samples of the albuterol rescue inhaler  Return to clinic in 6 months.

## 2016-02-18 NOTE — Progress Notes (Signed)
Villa Herbhomas R Duet    [id number]    1940/10/09  Primary Care Physician:POLITE,RONALD D, MD  Referring Physician: Renford Dillsonald Polite, MD [address] [place], [place] [address]  Chief complaint:  Follow up for cough, congestion.  HPI: [name] is a 76 year old with past medical history of hypertension, dementia, hearing loss. He moved here from [place] 3 years ago to be closer to his family. He complains of chronic cough for the past 8 months with chest congestion, yellow mucus production. He has dyspnea with exertion, not at rest. No associated wheezing, fevers, chills. He was on inhalers more than 3 years ago but does not recall the name.  His primary care physician started him on Zyrtec for allergic rhinitis, postnasal drip a few weeks ago. He has not noticed any difference in symptoms. He was also given an albuterol inhaler but has not filled the prescription yet. He has occasional seasonal allergies, denies any GERD, heartburn. An extensive smoking history, quit in 1997. 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 He does not report any asbestos or other relevant exposures.  Interim history: He was given samples of Advair at last visit. He is using this only intermittently and has not noticed any change in his breathing. He is using the Flonase and Astelin nasal sprays as prescribed. He was also prescribed chlorphentermine but is not using it. He reports that his cough has improved slightly. He does not have any dyspnea.  Outpatient Encounter Prescriptions as of 02/18/2016  Medication Sig  . aspirin (BAYER LOW DOSE) 81 MG EC tablet Take 81 mg by mouth daily. Swallow whole.  Marland Kitchen. azelastine (ASTELIN) 0.1 % nasal spray Place 2 sprays into both nostrils daily.  . Azelastine-Fluticasone (DYMISTA) 137-50 MCG/ACT SUSP Place 2 sprays into both nostrils daily.  . betamethasone dipropionate (DIPROLENE) 0.05 % cream Apply topically daily as needed.  .  cetirizine (ZYRTEC) 10 MG tablet Take 10 mg by mouth daily.  . chlorpheniramine (CHLOR-TRIMETON) 4 MG tablet Take 2 tablets (8 mg total) by mouth 3 (three) times daily.  Marland Kitchen. donepezil (ARICEPT) 5 MG tablet Take 5 mg by mouth at bedtime.  . fluticasone (FLONASE) 50 MCG/ACT nasal spray Place 2 sprays into both nostrils daily.  . Fluticasone-Salmeterol (ADVAIR DISKUS) 100-50 MCG/DOSE AEPB Inhale 1 puff into the lungs 2 (two) times daily.  Marland Kitchen. losartan (COZAAR) 50 MG tablet Take 50 mg by mouth daily.  . meloxicam (MOBIC) 7.5 MG tablet Take 7.5 mg by mouth daily as needed for pain.  . vitamin B-12 (CYANOCOBALAMIN) 1000 MCG tablet Take 1,000 mcg by mouth daily.  . [DISCONTINUED] Azelastine-Fluticasone (DYMISTA) 137-50 MCG/ACT SUSP Place 2 sprays into both nostrils daily.   No facility-administered encounter medications on file as of 02/18/2016.     Allergies as of 02/18/2016 - Review Complete 02/18/2016  Allergen Reaction Noted  . Other  05/21/2013    Past Medical History:  Diagnosis Date  . B12 deficiency   . BPH (benign prostatic hyperplasia)   . DM (diabetes mellitus) (HCC)   . HTN (hypertension)     Past Surgical History:  Procedure Laterality Date  . SHOULDER SURGERY Left 2000    Family History  Problem Relation Age of Onset  . Aneurysm Mother   . Colon cancer Father     Social History   Social History  . Marital status: Widowed    Spouse name: John Valdez  . Number of  children: 4  . Years of education: N/A   Occupational History  . Retired    Social History Main Topics  . Smoking status: Former Smoker    Packs/day: 2.00    Years: 25.00    Types: Cigars, Cigarettes    Quit date: 01/11/1995  . Smokeless tobacco: Never Used     Comment: smoked cigarettes x17yrs, then smoked cigars  . Alcohol use No     Comment: quit 30 yrs ago  . Drug use: No  . Sexual activity: Not on file   Other Topics Concern  . Not on file   Social History Narrative   Widowed   Lives with  daughter   4 children   Caffeine use: 2 cups daily   Travels to [place] frequently (every couple of months) for about 1 week at a time   OCCUPATION: custodial work for schools   Review of systems: Review of Systems  Constitutional: Negative for fever and chills.  HENT: Negative.   Eyes: Negative for blurred vision.  Respiratory: as per HPI  Cardiovascular: Negative for chest pain and palpitations.  Gastrointestinal: Negative for vomiting, diarrhea, blood per rectum. Genitourinary: Negative for dysuria, urgency, frequency and hematuria.  Musculoskeletal: Negative for myalgias, back pain and joint pain.  Skin: Negative for itching and rash.  Neurological: Negative for dizziness, tremors, focal weakness, seizures and loss of consciousness.  Endo/Heme/Allergies: Negative for environmental allergies.  Psychiatric/Behavioral: Negative for depression, suicidal ideas and hallucinations.  All other systems reviewed and are negative.  Physical Exam: Blood pressure 124/68, pulse 65, height 5\' 6"  (1.676 m), weight 79.8 kg (176 lb), SpO2 99 %. Gen:      No acute distress HEENT:  EOMI, sclera anicteric Neck:     No masses; no thyromegaly Lungs:    Clear to auscultation bilaterally; normal respiratory effort CV:         Regular rate and rhythm; no murmurs Abd:      + bowel sounds; soft, non-tender; no palpable masses, no distension Ext:    No edema; adequate peripheral perfusion Skin:      Warm and dry; no rash Neuro: alert and oriented x 3 Psych: normal mood and affect  Data Reviewed: CXR 01/23/15-hyperinflation, no acute cardiac pulmonary and normal. CXR 11/24/15-hyperinflation with no acute cardiopulmonary abnormality  Images personally reviewed.  PFTs 01/01/16 FVC 3.21 (89%) FEV1 2.40 (90%) F/F 75 TLC 88% DLCO 86%. Normal lung function.   Assessment:  Follow up for chronic cough, congestion. Mr. Zeringue likely has cough from upper airway syndrome secondary to GERD, allergic rhinitis.  He continues on the Flonase and Astelin nasal sprays.  Chest x-rays and PFTs were reviewed with him today. Although the x-ray continues to show hyperinflation There is no evidence of obstructive lung disease on PFTs. The Advair has not helped and I don't believe he needs to be on a long-term medication. He will just use the albuterol rescue inhaler He has a heavy smoking history but is not a candidate for screening CTs as he quit over 15 yrs ago.   Plan/Recommendations: - Continue flonase and astelin - Stop advair. Start albuterol rescue inhaler.   Chilton Greathouse MD [facility] Pager [phone] 02/18/2016, 11:07 AM  CC: Renford Dills, MD

## 2016-02-22 ENCOUNTER — Other Ambulatory Visit: Payer: Self-pay

## 2016-02-22 MED ORDER — ALBUTEROL SULFATE HFA 108 (90 BASE) MCG/ACT IN AERS: 1.0000 | INHALATION_SPRAY | Freq: Four times a day (QID) | RESPIRATORY_TRACT | 3 refills | Status: AC | PRN

## 2016-08-03 DIAGNOSIS — I10 Essential (primary) hypertension: Secondary | ICD-10-CM | POA: Diagnosis not present

## 2016-08-03 DIAGNOSIS — E119 Type 2 diabetes mellitus without complications: Secondary | ICD-10-CM | POA: Diagnosis not present

## 2016-08-03 DIAGNOSIS — R4689 Other symptoms and signs involving appearance and behavior: Secondary | ICD-10-CM | POA: Diagnosis not present

## 2016-08-03 DIAGNOSIS — R413 Other amnesia: Secondary | ICD-10-CM | POA: Diagnosis not present

## 2016-09-16 ENCOUNTER — Telehealth: Payer: Self-pay | Admitting: Pulmonary Disease

## 2016-09-16 NOTE — Telephone Encounter (Signed)
Message started in error.  

## 2016-09-19 DIAGNOSIS — M7661 Achilles tendinitis, right leg: Secondary | ICD-10-CM | POA: Diagnosis not present

## 2016-09-29 DIAGNOSIS — M25671 Stiffness of right ankle, not elsewhere classified: Secondary | ICD-10-CM | POA: Diagnosis not present

## 2016-09-29 DIAGNOSIS — R262 Difficulty in walking, not elsewhere classified: Secondary | ICD-10-CM | POA: Diagnosis not present

## 2016-09-29 DIAGNOSIS — R261 Paralytic gait: Secondary | ICD-10-CM | POA: Diagnosis not present

## 2016-09-29 DIAGNOSIS — M25571 Pain in right ankle and joints of right foot: Secondary | ICD-10-CM | POA: Diagnosis not present

## 2016-10-06 DIAGNOSIS — R531 Weakness: Secondary | ICD-10-CM | POA: Diagnosis not present

## 2016-10-06 DIAGNOSIS — M7661 Achilles tendinitis, right leg: Secondary | ICD-10-CM | POA: Diagnosis not present

## 2016-10-06 DIAGNOSIS — M25571 Pain in right ankle and joints of right foot: Secondary | ICD-10-CM | POA: Diagnosis not present

## 2016-10-06 DIAGNOSIS — M25671 Stiffness of right ankle, not elsewhere classified: Secondary | ICD-10-CM | POA: Diagnosis not present

## 2016-10-12 ENCOUNTER — Encounter: Payer: Self-pay | Admitting: Pulmonary Disease

## 2016-10-12 ENCOUNTER — Ambulatory Visit (INDEPENDENT_AMBULATORY_CARE_PROVIDER_SITE_OTHER): Payer: Medicare Other | Admitting: Pulmonary Disease

## 2016-10-12 VITALS — BP 110/70 | HR 57 | Ht 66.0 in | Wt 168.2 lb

## 2016-10-12 DIAGNOSIS — R059 Cough, unspecified: Secondary | ICD-10-CM

## 2016-10-12 DIAGNOSIS — M25571 Pain in right ankle and joints of right foot: Secondary | ICD-10-CM | POA: Diagnosis not present

## 2016-10-12 DIAGNOSIS — J439 Emphysema, unspecified: Secondary | ICD-10-CM | POA: Diagnosis not present

## 2016-10-12 DIAGNOSIS — R531 Weakness: Secondary | ICD-10-CM | POA: Diagnosis not present

## 2016-10-12 DIAGNOSIS — Z23 Encounter for immunization: Secondary | ICD-10-CM | POA: Diagnosis not present

## 2016-10-12 DIAGNOSIS — M7661 Achilles tendinitis, right leg: Secondary | ICD-10-CM | POA: Diagnosis not present

## 2016-10-12 DIAGNOSIS — R05 Cough: Secondary | ICD-10-CM | POA: Diagnosis not present

## 2016-10-12 DIAGNOSIS — M25671 Stiffness of right ankle, not elsewhere classified: Secondary | ICD-10-CM | POA: Diagnosis not present

## 2016-10-12 MED ORDER — TIOTROPIUM BROMIDE-OLODATEROL 2.5-2.5 MCG/ACT IN AERS: 2.0000 | INHALATION_SPRAY | Freq: Every day | RESPIRATORY_TRACT | 0 refills | Status: AC

## 2016-10-12 NOTE — Patient Instructions (Signed)
Continue using the albuterol inhaler. He needs to use this only as needed We will give a sample of the stiolto inhaler. Please use this once a day on a regular basis. If you feel this is helping with her breathing did give [place] a call and we'll send in a regular prescription Use Mucinex over-the-counter twice daily for chest congestion We will get a flu shot today  Return to clinic in 6 months.

## 2016-10-12 NOTE — Progress Notes (Signed)
[name]    [id number]    25-Dec-1940  Primary Care Physician:Polite, Windy Fast, MD  Referring Physician: Renford Dills, MD [address] [place], [place] [address]  Chief complaint:  Follow up for  Cough Emphysema  HPI: John Valdez is a 76 year old with past medical history of hypertension, dementia, hearing loss. He moved here from [place] in 2014 to be closer to his family. He complains of chronic cough for the past 8 months with chest congestion, yellow mucus production. He has dyspnea with exertion, not at rest. No associated wheezing, fevers, chills. He was on inhalers more than 3 years ago but does not recall the name.  His primary care physician started him on Zyrtec for allergic rhinitis, postnasal drip a few weeks ago. He has not noticed any difference in symptoms. He was also given an albuterol inhaler but has not filled the prescription yet. He has occasional seasonal allergies, denies any GERD, heartburn. An extensive smoking history, quit in 1997. 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 He does not report any asbestos or other relevant exposures.  Interim history: He continues to have cough with chest congestion. Denies any dyspnea, wheezing. He is just on albuterol which he is using twice daily.  Outpatient Encounter Prescriptions as of 10/12/2016  Medication Sig  . albuterol (PROAIR HFA) 108 (90 Base) MCG/ACT inhaler Inhale 1-2 puffs into the lungs every 6 (six) hours as needed for wheezing or shortness of breath.  Marland Kitchen aspirin (BAYER LOW DOSE) 81 MG EC tablet Take 81 mg by mouth daily. Swallow whole.  . Azelastine-Fluticasone (DYMISTA) 137-50 MCG/ACT SUSP Place 2 sprays into both nostrils daily.  . betamethasone dipropionate (DIPROLENE) 0.05 % cream Apply topically daily as needed.  . cetirizine (ZYRTEC) 10 MG tablet Take 10 mg by mouth daily.  . chlorpheniramine (CHLOR-TRIMETON) 4 MG tablet Take 2 tablets (8 mg total) by  mouth 3 (three) times daily.  Marland Kitchen donepezil (ARICEPT) 5 MG tablet Take 5 mg by mouth at bedtime.  . fluticasone (FLONASE) 50 MCG/ACT nasal spray Place 2 sprays into both nostrils daily.  Marland Kitchen losartan (COZAAR) 50 MG tablet Take 50 mg by mouth daily.  . meloxicam (MOBIC) 7.5 MG tablet Take 7.5 mg by mouth daily as needed for pain.  . vitamin B-12 (CYANOCOBALAMIN) 1000 MCG tablet Take 1,000 mcg by mouth daily.  . [DISCONTINUED] albuterol (PROVENTIL HFA;VENTOLIN HFA) 108 (90 Base) MCG/ACT inhaler Inhale 2 puffs into the lungs every 6 (six) hours as needed for wheezing or shortness of breath.  . [DISCONTINUED] azelastine (ASTELIN) 0.1 % nasal spray Place 2 sprays into both nostrils daily.  . Fluticasone-Salmeterol (ADVAIR DISKUS) 100-50 MCG/DOSE AEPB Inhale 1 puff into the lungs 2 (two) times daily. (Patient not taking: Reported on 10/12/2016)   No facility-administered encounter medications on file as of 10/12/2016.     Allergies as of 10/12/2016 - Review Complete 10/12/2016  Allergen Reaction Noted  . Other  05/21/2013    Past Medical History:  Diagnosis Date  . B12 deficiency   . BPH (benign prostatic hyperplasia)   . DM (diabetes mellitus) (HCC)   . HTN (hypertension)     Past Surgical History:  Procedure Laterality Date  . SHOULDER SURGERY Left 2000    Family History  Problem Relation Age of Onset  . Aneurysm Mother   . Colon cancer Father     Social History   Social History  .  Marital status: Widowed    Spouse name: [name]  . Number of children: 4  . Years of education: N/A   Occupational History  . Retired    Social History Main Topics  . Smoking status: Former Smoker    Packs/day: 2.00    Years: 25.00    Types: Cigars, Cigarettes    Quit date: 01/11/1995  . Smokeless tobacco: Never Used     Comment: smoked cigarettes x69yrs, then smoked cigars  . Alcohol use No     Comment: quit 30 yrs ago  . Drug use: No  . Sexual activity: Not on file   Other Topics Concern    . Not on file   Social History Narrative   Widowed   Lives with daughter   4 children   Caffeine use: 2 cups daily   Travels to [place] frequently (every couple of months) for about 1 week at a time   OCCUPATION: custodial work for schools   Review of systems: Review of Systems  Constitutional: Negative for fever and chills.  HENT: Negative.   Eyes: Negative for blurred vision.  Respiratory: as per HPI  Cardiovascular: Negative for chest pain and palpitations.  Gastrointestinal: Negative for vomiting, diarrhea, blood per rectum. Genitourinary: Negative for dysuria, urgency, frequency and hematuria.  Musculoskeletal: Negative for myalgias, back pain and joint pain.  Skin: Negative for itching and rash.  Neurological: Negative for dizziness, tremors, focal weakness, seizures and loss of consciousness.  Endo/Heme/Allergies: Negative for environmental allergies.  Psychiatric/Behavioral: Negative for depression, suicidal ideas and hallucinations.  All other systems reviewed and are negative.  Physical Exam: Blood pressure 110/70, pulse (!) 57, height  (1.676 m), weight 76.3 kg (168 lb 3.2 oz), SpO2 98 %. Gen:      No acute distress HEENT:  EOMI, sclera anicteric Neck:     No masses; no thyromegaly Lungs:    Clear to auscultation bilaterally; normal respiratory effort CV:         Regular rate and rhythm; no murmurs Abd:      + bowel sounds; soft, non-tender; no palpable masses, no distension Ext:    No edema; adequate peripheral perfusion Skin:      Warm and dry; no rash Neuro: alert and oriented x 3 Psych: normal mood and affect  Data Reviewed: CXR 01/23/15-hyperinflation, no acute cardiac pulmonary and normal. CXR 11/24/15-hyperinflation with no acute cardiopulmonary abnormality  Images personally reviewed.  PFTs 01/01/16 FVC 3.21 (89%), FEV1 2.40 (90%), F/F 75, TLC 88%, DLCO 86%. Normal lung function.   Assessment:  Follow up for chronic cough, congestion. [name]  likely has cough from upper airway syndrome secondary to GERD, allergic rhinitis. He continues on the Flonase and Astelin nasal sprays.  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  Advair has not helped in the past. Since he continues to have symptoms with chest congestion will try him on a LABA/LAMA inhaler. He'll be given samples of Stiolto. If symptoms ar better then we give [place] a call to get a regular prescription called in. I have asked him to use Mucinex over-the-counter for chest congestion.  He has a heavy smoking history but is not a candidate for screening CTs as he quit over 15 yrs ago.   Plan/Recommendations: - Continue albuterol, start stiolto - Mucinex for chest congestion.   Chilton Greathouse MD [facility] Pager [phone] 10/12/2016, 10:01 AM  CC: Renford Dills, MD

## 2016-10-12 NOTE — Addendum Note (Signed)
Addended by: Maxwell Marion A on: 10/12/2016 10:34 AM   Modules accepted: Orders

## 2016-10-13 DIAGNOSIS — M25571 Pain in right ankle and joints of right foot: Secondary | ICD-10-CM | POA: Diagnosis not present

## 2016-10-13 DIAGNOSIS — M25671 Stiffness of right ankle, not elsewhere classified: Secondary | ICD-10-CM | POA: Diagnosis not present

## 2016-10-13 DIAGNOSIS — R262 Difficulty in walking, not elsewhere classified: Secondary | ICD-10-CM | POA: Diagnosis not present

## 2016-10-13 DIAGNOSIS — M7661 Achilles tendinitis, right leg: Secondary | ICD-10-CM | POA: Diagnosis not present

## 2016-10-19 DIAGNOSIS — M25571 Pain in right ankle and joints of right foot: Secondary | ICD-10-CM | POA: Diagnosis not present

## 2016-10-19 DIAGNOSIS — R531 Weakness: Secondary | ICD-10-CM | POA: Diagnosis not present

## 2016-10-19 DIAGNOSIS — M25671 Stiffness of right ankle, not elsewhere classified: Secondary | ICD-10-CM | POA: Diagnosis not present

## 2016-10-19 DIAGNOSIS — R262 Difficulty in walking, not elsewhere classified: Secondary | ICD-10-CM | POA: Diagnosis not present

## 2016-10-20 DIAGNOSIS — M25571 Pain in right ankle and joints of right foot: Secondary | ICD-10-CM | POA: Diagnosis not present

## 2016-10-20 DIAGNOSIS — M25671 Stiffness of right ankle, not elsewhere classified: Secondary | ICD-10-CM | POA: Diagnosis not present

## 2016-10-20 DIAGNOSIS — M7661 Achilles tendinitis, right leg: Secondary | ICD-10-CM | POA: Diagnosis not present

## 2016-10-20 DIAGNOSIS — R531 Weakness: Secondary | ICD-10-CM | POA: Diagnosis not present

## 2016-10-25 DIAGNOSIS — R531 Weakness: Secondary | ICD-10-CM | POA: Diagnosis not present

## 2016-10-25 DIAGNOSIS — M25571 Pain in right ankle and joints of right foot: Secondary | ICD-10-CM | POA: Diagnosis not present

## 2016-10-25 DIAGNOSIS — R262 Difficulty in walking, not elsewhere classified: Secondary | ICD-10-CM | POA: Diagnosis not present

## 2016-10-25 DIAGNOSIS — M25671 Stiffness of right ankle, not elsewhere classified: Secondary | ICD-10-CM | POA: Diagnosis not present

## 2016-11-03 DIAGNOSIS — H2513 Age-related nuclear cataract, bilateral: Secondary | ICD-10-CM | POA: Diagnosis not present

## 2016-11-15 DIAGNOSIS — M25571 Pain in right ankle and joints of right foot: Secondary | ICD-10-CM | POA: Diagnosis not present

## 2016-11-15 DIAGNOSIS — R531 Weakness: Secondary | ICD-10-CM | POA: Diagnosis not present

## 2016-11-15 DIAGNOSIS — M7661 Achilles tendinitis, right leg: Secondary | ICD-10-CM | POA: Diagnosis not present

## 2016-11-15 DIAGNOSIS — M25671 Stiffness of right ankle, not elsewhere classified: Secondary | ICD-10-CM | POA: Diagnosis not present

## 2016-11-17 DIAGNOSIS — M25571 Pain in right ankle and joints of right foot: Secondary | ICD-10-CM | POA: Diagnosis not present

## 2016-11-17 DIAGNOSIS — R531 Weakness: Secondary | ICD-10-CM | POA: Diagnosis not present

## 2016-11-17 DIAGNOSIS — M25671 Stiffness of right ankle, not elsewhere classified: Secondary | ICD-10-CM | POA: Diagnosis not present

## 2016-11-17 DIAGNOSIS — M7661 Achilles tendinitis, right leg: Secondary | ICD-10-CM | POA: Diagnosis not present

## 2016-11-29 ENCOUNTER — Telehealth: Payer: Self-pay | Admitting: Pulmonary Disease

## 2016-11-29 MED ORDER — TIOTROPIUM BROMIDE-OLODATEROL 2.5-2.5 MCG/ACT IN AERS: 2.0000 | INHALATION_SPRAY | Freq: Every day | RESPIRATORY_TRACT | 3 refills | Status: DC

## 2016-11-29 NOTE — Telephone Encounter (Signed)
Spoke with pt' daughter and advised rx sent to pharmacy. Nothing further is needed.   

## 2016-12-05 ENCOUNTER — Telehealth: Payer: Self-pay

## 2016-12-05 NOTE — Telephone Encounter (Signed)
Received note from [facility], states that Stiolto is currently on back order and request Rx for Anoro.  Pt was prescribed Stiolto during 10/12/16 OV.  PM please advise. Thanks.

## 2016-12-06 MED ORDER — UMECLIDINIUM-VILANTEROL 62.5-25 MCG/INH IN AEPB: 1.0000 | INHALATION_SPRAY | Freq: Every day | RESPIRATORY_TRACT | 5 refills | Status: DC

## 2016-12-06 NOTE — Telephone Encounter (Signed)
OK to give anoro instead of stiolto.

## 2017-01-13 DIAGNOSIS — M79601 Pain in right arm: Secondary | ICD-10-CM | POA: Diagnosis not present

## 2017-01-26 DIAGNOSIS — M25541 Pain in joints of right hand: Secondary | ICD-10-CM | POA: Diagnosis not present

## 2017-02-10 DIAGNOSIS — E538 Deficiency of other specified B group vitamins: Secondary | ICD-10-CM | POA: Diagnosis not present

## 2017-02-10 DIAGNOSIS — R413 Other amnesia: Secondary | ICD-10-CM | POA: Diagnosis not present

## 2017-02-10 DIAGNOSIS — Z1389 Encounter for screening for other disorder: Secondary | ICD-10-CM | POA: Diagnosis not present

## 2017-02-10 DIAGNOSIS — E119 Type 2 diabetes mellitus without complications: Secondary | ICD-10-CM | POA: Diagnosis not present

## 2017-02-10 DIAGNOSIS — I10 Essential (primary) hypertension: Secondary | ICD-10-CM | POA: Diagnosis not present

## 2017-02-10 DIAGNOSIS — E7800 Pure hypercholesterolemia, unspecified: Secondary | ICD-10-CM | POA: Diagnosis not present

## 2017-02-10 DIAGNOSIS — Z7189 Other specified counseling: Secondary | ICD-10-CM | POA: Diagnosis not present

## 2017-02-10 DIAGNOSIS — Z0000 Encounter for general adult medical examination without abnormal findings: Secondary | ICD-10-CM | POA: Diagnosis not present

## 2017-04-18 ENCOUNTER — Encounter: Payer: Self-pay | Admitting: Pulmonary Disease

## 2017-04-18 ENCOUNTER — Ambulatory Visit (INDEPENDENT_AMBULATORY_CARE_PROVIDER_SITE_OTHER): Payer: Medicare Other | Admitting: Pulmonary Disease

## 2017-04-18 VITALS — BP 132/80 | HR 63 | Ht 66.0 in | Wt 167.2 lb

## 2017-04-18 DIAGNOSIS — J439 Emphysema, unspecified: Secondary | ICD-10-CM

## 2017-04-18 DIAGNOSIS — R05 Cough: Secondary | ICD-10-CM

## 2017-04-18 DIAGNOSIS — R059 Cough, unspecified: Secondary | ICD-10-CM

## 2017-04-18 MED ORDER — CHLORPHENIRAMINE MALEATE 4 MG PO TABS: 8.0000 mg | ORAL_TABLET | Freq: Three times a day (TID) | ORAL | 1 refills | Status: AC | PRN

## 2017-04-18 NOTE — Patient Instructions (Signed)
I am glad you are doing well with regard to your breathing Continue inhalers as needed We will call in a prescription for chlorpheniramine 4-8 mg up to 3 times a day as needed Please check with the pharmacy if it works out cheaper with a prescription or buying over-the-counter  Follow-up in 6 months

## 2017-04-18 NOTE — Progress Notes (Signed)
John Valdez    [id number]    [birth year]  Primary Care Physician:Polite, Windy Fast, MD  Referring Physician: Renford Dills, MD [address] [place], [place] [address]  Chief complaint:  Follow up for  Cough Emphysema  HPI: [name] is a 77 year old with past medical history of hypertension, dementia, hearing loss. He moved here from [place] in 2014 to be closer to his family. He complains of chronic cough for the past 8 months with chest congestion, yellow mucus production. He has dyspnea with exertion, not at rest. No associated wheezing, fevers, chills. He was on inhalers more than 3 years ago but does not recall the name.  His primary care physician started him on Zyrtec for allergic rhinitis, postnasal drip a few weeks ago. He has not noticed any difference in symptoms. He was also given an albuterol inhaler but has not filled the prescription yet. He has occasional seasonal allergies, denies any GERD, heartburn. An extensive smoking history, quit in 1997. 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 He does not report any asbestos or other relevant exposures.  Interim history: Chief complaint today is allergic rhinitis, postnasal drip, cough.  States that his breathing is doing better on the anoro inhaler.  No other new complaints today.  Outpatient Encounter Medications as of 04/18/2017  Medication Sig  . aspirin (BAYER LOW DOSE) 81 MG EC tablet Take 81 mg by mouth daily. Swallow whole.  . Azelastine-Fluticasone (DYMISTA) 137-50 MCG/ACT SUSP Place 2 sprays into both nostrils daily.  . betamethasone dipropionate (DIPROLENE) 0.05 % cream Apply topically daily as needed.  . cetirizine (ZYRTEC) 10 MG tablet Take 10 mg by mouth daily.  Marland Kitchen donepezil (ARICEPT) 5 MG tablet Take 5 mg by mouth at bedtime.  . fluticasone (FLONASE) 50 MCG/ACT nasal spray Place 2 sprays into both nostrils daily.  Marland Kitchen losartan (COZAAR) 50 MG tablet Take 50 mg by  mouth daily.  . meloxicam (MOBIC) 7.5 MG tablet Take 7.5 mg by mouth daily as needed for pain.  Marland Kitchen umeclidinium-vilanterol (ANORO ELLIPTA) 62.5-25 MCG/INH AEPB Inhale 1 puff into the lungs daily.  . vitamin B-12 (CYANOCOBALAMIN) 1000 MCG tablet Take 1,000 mcg by mouth daily.  Marland Kitchen albuterol (PROAIR HFA) 108 (90 Base) MCG/ACT inhaler Inhale 1-2 puffs into the lungs every 6 (six) hours as needed for wheezing or shortness of breath. (Patient not taking: Reported on 04/18/2017)  . chlorpheniramine (CHLOR-TRIMETON) 4 MG tablet Take 2 tablets (8 mg total) by mouth 3 (three) times daily. (Patient not taking: Reported on 04/18/2017)  . Fluticasone-Salmeterol (ADVAIR DISKUS) 100-50 MCG/DOSE AEPB Inhale 1 puff into the lungs 2 (two) times daily. (Patient not taking: Reported on 04/18/2017)  . Tiotropium Bromide-Olodaterol (STIOLTO RESPIMAT) 2.5-2.5 MCG/ACT AERS Inhale 2 puffs into the lungs daily. (Patient not taking: Reported on 04/18/2017)   No facility-administered encounter medications on file as of 04/18/2017.     Allergies as of 04/18/2017 - Review Complete 04/18/2017  Allergen Reaction Noted  . Other  05/21/2013    Past Medical History:  Diagnosis Date  . B12 deficiency   . BPH (benign prostatic hyperplasia)   . DM (diabetes mellitus) (HCC)   . HTN (hypertension)     Past Surgical History:  Procedure Laterality Date  . SHOULDER SURGERY Left 2000    Family History  Problem Relation Age of Onset  . Aneurysm Mother   . Colon cancer Father  Social History   Socioeconomic History  . Marital status: Widowed    Spouse name: John Valdez  . Number of children: 4  . Years of education: N/A  . Highest education level: Not on file  Occupational History  . Occupation: Retired  Engineer, productionocial Needs  . Financial resource strain: Not on file  . Food insecurity:    Worry: Not on file    Inability: Not on file  . Transportation needs:    Medical: Not on file    Non-medical: Not on file  Tobacco Use  .  Smoking status: Former Smoker    Packs/day: 2.00    Years: 25.00    Pack years: 50.00    Types: Cigars, Cigarettes    Last attempt to quit: 01/11/1995    Years since quitting: 22.2  . Smokeless tobacco: Never Used  . Tobacco comment: smoked cigarettes x7271yrs, then smoked cigars  Substance and Sexual Activity  . Alcohol use: No    Comment: quit 30 yrs ago  . Drug use: No  . Sexual activity: Not on file  Lifestyle  . Physical activity:    Days per week: Not on file    Minutes per session: Not on file  . Stress: Not on file  Relationships  . Social connections:    Talks on phone: Not on file    Gets together: Not on file    Attends religious service: Not on file    Active member of club or organization: Not on file    Attends meetings of clubs or organizations: Not on file    Relationship status: Not on file  . Intimate partner violence:    Fear of current or ex partner: Not on file    Emotionally abused: Not on file    Physically abused: Not on file    Forced sexual activity: Not on file  Other Topics Concern  . Not on file  Social History Narrative   Widowed   Lives with daughter   4 children   Caffeine use: 2 cups daily   Travels to [place] frequently (every couple of months) for about 1 week at a time   OCCUPATION: custodial work for schools   Review of systems: Review of Systems  Constitutional: Negative for fever and chills.  HENT: Negative.   Eyes: Negative for blurred vision.  Respiratory: as per HPI  Cardiovascular: Negative for chest pain and palpitations.  Gastrointestinal: Negative for vomiting, diarrhea, blood per rectum. Genitourinary: Negative for dysuria, urgency, frequency and hematuria.  Musculoskeletal: Negative for myalgias, back pain and joint pain.  Skin: Negative for itching and rash.  Neurological: Negative for dizziness, tremors, focal weakness, seizures and loss of consciousness.  Endo/Heme/Allergies: Negative for environmental allergies.    Psychiatric/Behavioral: Negative for depression, suicidal ideas and hallucinations.  All other systems reviewed and are negative.  Physical Exam: Blood pressure 132/80, pulse 63, height 5\' 6"  (1.676 m), weight 167 lb 3.2 oz (75.8 kg), SpO2 97 %. Gen:      No acute distress HEENT:  EOMI, sclera anicteric Neck:     No masses; no thyromegaly Lungs:    Clear to auscultation bilaterally; normal respiratory effort CV:         Regular rate and rhythm; no murmurs Abd:      + bowel sounds; soft, non-tender; no palpable masses, no distension Ext:    No edema; adequate peripheral perfusion Skin:      Warm and dry; no rash Neuro: alert and oriented x 3  Psych: normal mood and affect  Data Reviewed: CXR 01/23/15-hyperinflation, no acute cardiac pulmonary and normal. CXR 11/24/15-hyperinflation with no acute cardiopulmonary abnormality  Images personally reviewed.  PFTs 01/01/16 FVC 3.21 (89%), FEV1 2.40 (90%), F/F 75, TLC 88%, DLCO 86%. Normal lung function.   Assessment:  Follow up for chronic cough, congestion. Mr. Ostrom likely has cough from upper airway syndrome secondary to GERD, allergic rhinitis. He continues on the Flonase and Astelin nasal sprays. Use over-the-counter antihistamine as needed.  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  Symptomatically has responded to Anoro and will continue the same. He has a heavy smoking history but is not a candidate for screening CTs as he quit over 15 yrs ago.   Health maintenance Flu 10/12/16 PCV 12 10/17/13, pneumovax 02/12/13  Plan/Recommendations: - Continue albuterol as needed, anoro - Antihistamine and steroid nasal spray - Mucinex for chest congestion.   Chilton Greathouse MD [facility] Pager [phone] 04/18/2017, 10:07 AM  CC: Renford Dills, MD

## 2017-08-10 DIAGNOSIS — E1169 Type 2 diabetes mellitus with other specified complication: Secondary | ICD-10-CM | POA: Diagnosis not present

## 2017-08-10 DIAGNOSIS — M79606 Pain in leg, unspecified: Secondary | ICD-10-CM | POA: Diagnosis not present

## 2017-08-10 DIAGNOSIS — I10 Essential (primary) hypertension: Secondary | ICD-10-CM | POA: Diagnosis not present

## 2017-08-10 DIAGNOSIS — E538 Deficiency of other specified B group vitamins: Secondary | ICD-10-CM | POA: Diagnosis not present

## 2017-08-10 DIAGNOSIS — R413 Other amnesia: Secondary | ICD-10-CM | POA: Diagnosis not present

## 2017-08-10 DIAGNOSIS — E7800 Pure hypercholesterolemia, unspecified: Secondary | ICD-10-CM | POA: Diagnosis not present

## 2017-08-17 ENCOUNTER — Other Ambulatory Visit: Payer: Self-pay | Admitting: Pulmonary Disease

## 2017-09-27 DIAGNOSIS — R4689 Other symptoms and signs involving appearance and behavior: Secondary | ICD-10-CM | POA: Diagnosis not present

## 2017-09-27 DIAGNOSIS — Z23 Encounter for immunization: Secondary | ICD-10-CM | POA: Diagnosis not present

## 2017-09-27 DIAGNOSIS — R413 Other amnesia: Secondary | ICD-10-CM | POA: Diagnosis not present

## 2017-10-23 ENCOUNTER — Other Ambulatory Visit: Payer: Self-pay | Admitting: Pulmonary Disease

## 2017-11-23 ENCOUNTER — Encounter: Payer: Self-pay | Admitting: *Deleted

## 2017-11-27 ENCOUNTER — Encounter: Payer: Self-pay | Admitting: Diagnostic Neuroimaging

## 2017-11-27 ENCOUNTER — Ambulatory Visit (INDEPENDENT_AMBULATORY_CARE_PROVIDER_SITE_OTHER): Payer: Medicare Other | Admitting: Diagnostic Neuroimaging

## 2017-11-27 VITALS — BP 131/68 | HR 55 | Ht 66.0 in | Wt 169.0 lb

## 2017-11-27 DIAGNOSIS — F0391 Unspecified dementia with behavioral disturbance: Secondary | ICD-10-CM

## 2017-11-27 DIAGNOSIS — F03B18 Unspecified dementia, moderate, with other behavioral disturbance: Secondary | ICD-10-CM

## 2017-11-27 MED ORDER — MEMANTINE HCL 10 MG PO TABS: 10.0000 mg | ORAL_TABLET | Freq: Two times a day (BID) | ORAL | 12 refills | Status: DC

## 2017-11-27 NOTE — Patient Instructions (Signed)
-   continue donepezil - start memantine 10mg  at bedtime; increase to twice a day after 1-2 weeks - consider SSRI in future (for severe anxiety) - increase safety / supervision; patient should not be left alone - caregiver resources provided - no driving

## 2017-11-27 NOTE — Progress Notes (Signed)
[facility]  PATIENT: John Valdez DOB: 1940-04-03  REFERRING CLINICIAN: R polite HISTORY FROM: patient  REASON FOR VISIT: new consult    HISTORICAL  CHIEF COMPLAINT:  Chief Complaint  Patient presents with  . Memory Loss    rm 6, dgtr- [name]  last seen 2015, MMSE 12    HISTORY OF PRESENT ILLNESS:   NEW HPI (11/27/17): 77 year old male here for evaluation of dementia.  Since last visit patient has progressively declined in terms of cognitive ability.  Also having some intermittent behavioral disturbances, inappropriate behaviors, exposing himself.  Generally he stays calm and to himself.  Family is taking care of him.  He is left alone for a few hours in the afternoon.  Sometimes his daughter sends him to the gym via [employer] transportation and patient is usually able to make to the gym and come back home.  However she is becoming increasing concerned about his ability to handle himself independently in social situations.  PRIOR HPI ([id number], VRP): 77 year old left-handed male here for evaluation of possible dementia. Patient had limited schooling. He is illiterate. 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 He retired approximately 10-15 years ago. Patient and his wife, who is also patient of mine, removed to [place] approximately one year ago to live with their daughter and her family as they were not able to take care of themselves.  Patient denies any significant memory problems. Patient's daughter has not noted any definite progressive memory problems. Patient was evaluated at PCP office who was concerned for possibility of dementia. According to daughter, patient has always been and then a few words throughout his life. They were never sure if this was related to his educational background or personality. Since moving to [place], patient's daughter has noted a few difficulties with his cognitive ability. He tends to repeat  himself when telling stories. Sometimes he cannot hear what is said to him. Recent has hearing aids but does not use them. Patient's daughter has not noted any change in his cognitive ability whether he has a hearing aid in or not. Patient having trouble with short-term memory problems. For example when playing pool, he sometimes forgets which balls he is supposed to be shooting. Patient having some difficulty with taking his medication, although he has never taken medication until just a few months ago. Patient does not have a driver's license, however sometimes he drives when his daughter is in the car.  REVIEW OF SYSTEMS: Full 14 system review of systems performed and negative with exception of: Memory loss dizziness disinterest activities shortness of breath cough weight loss hearing loss ringing in ears.  ALLERGIES: Allergies  Allergen Reactions  . Other     seasonal    HOME MEDICATIONS: Outpatient Medications Prior to Visit  Medication Sig Dispense Refill  . ANORO ELLIPTA 62.5-25 MCG/INH AEPB INHALE 1 PUFF BY MOUTH ONCE DAILY 60 each 5  . aspirin (BAYER LOW DOSE) 81 MG EC tablet Take 81 mg by mouth daily. Swallow whole.    . cetirizine (ZYRTEC) 10 MG tablet Take 10 mg by mouth daily.    Marland Kitchen donepezil (ARICEPT) 10 MG tablet Take 10 mg by mouth at bedtime.  3  . losartan (COZAAR) 50 MG tablet Take 50 mg by mouth daily.    . meloxicam (MOBIC) 7.5 MG tablet Take 7.5 mg by mouth daily as needed for pain.    . vitamin B-12 (  CYANOCOBALAMIN) 1000 MCG tablet Take 1,000 mcg by mouth daily.    Marland Kitchen donepezil (ARICEPT) 5 MG tablet Take 5 mg by mouth at bedtime.    Marland Kitchen albuterol (PROAIR HFA) 108 (90 Base) MCG/ACT inhaler Inhale 1-2 puffs into the lungs every 6 (six) hours as needed for wheezing or shortness of breath. (Patient not taking: Reported on 11/27/2017) 1 Inhaler 3  . betamethasone dipropionate (DIPROLENE) 0.05 % cream Apply topically daily as needed.    . chlorpheniramine (CHLOR-TRIMETON) 4 MG  tablet Take 2 tablets (8 mg total) by mouth 3 (three) times daily as needed for allergies. (Patient not taking: Reported on 11/27/2017) 190 tablet 1  . Fluticasone-Salmeterol (ADVAIR DISKUS) 100-50 MCG/DOSE AEPB Inhale 1 puff into the lungs 2 (two) times daily. (Patient not taking: Reported on 04/18/2017) 1 each 5  . Azelastine-Fluticasone (DYMISTA) 137-50 MCG/ACT SUSP Place 2 sprays into both nostrils daily. 1 Bottle 3  . chlorpheniramine (CHLOR-TRIMETON) 4 MG tablet Take 2 tablets (8 mg total) by mouth 3 (three) times daily. (Patient not taking: Reported on 04/18/2017) 180 tablet 3  . fluticasone (FLONASE) 50 MCG/ACT nasal spray Place 2 sprays into both nostrils daily. 16 g 5  . Tiotropium Bromide-Olodaterol (STIOLTO RESPIMAT) 2.5-2.5 MCG/ACT AERS Inhale 2 puffs into the lungs daily. (Patient not taking: Reported on 04/18/2017) 1 Inhaler 3   No facility-administered medications prior to visit.     PAST MEDICAL HISTORY: Past Medical History:  Diagnosis Date  . B12 deficiency   . BPH (benign prostatic hyperplasia)   . DM (diabetes mellitus) (HCC)    type 2  . HTN (hypertension)   . Hyperlipidemia   . Memory loss     PAST SURGICAL HISTORY: Past Surgical History:  Procedure Laterality Date  . SHOULDER SURGERY Left 2000  . TOE SURGERY N/A     FAMILY HISTORY: Family History  Problem Relation Age of Onset  . Aneurysm Mother   . Colon cancer Father     SOCIAL HISTORY: Social History   Socioeconomic History  . Marital status: Widowed    Spouse name: John Valdez  . Number of children: 4  . Years of education: some high school  . Highest education level: Not on file  Occupational History  . Occupation: Retired  Engineer, production  . Financial resource strain: Not on file  . Food insecurity:    Worry: Not on file    Inability: Not on file  . Transportation needs:    Medical: Not on file    Non-medical: Not on file  Tobacco Use  . Smoking status: Former Smoker    Packs/day: 2.00     Years: 25.00    Pack years: 50.00    Types: Cigars, Cigarettes    Last attempt to quit: 01/11/1995    Years since quitting: 22.8  . Smokeless tobacco: Never Used  . Tobacco comment: smoked cigarettes x72yrs, then smoked cigars  Substance and Sexual Activity  . Alcohol use: No    Comment: quit 30 yrs ago  . Drug use: No  . Sexual activity: Not on file  Lifestyle  . Physical activity:    Days per week: Not on file    Minutes per session: Not on file  . Stress: Not on file  Relationships  . Social connections:    Talks on phone: Not on file    Gets together: Not on file    Attends religious service: Not on file    Active member of club or organization: Not on file  Attends meetings of clubs or organizations: Not on file    Relationship status: Not on file  . Intimate partner violence:    Fear of current or ex partner: Not on file    Emotionally abused: Not on file    Physically abused: Not on file    Forced sexual activity: Not on file  Other Topics Concern  . Not on file  Social History Narrative   Widowed   11/27/17 Lives with daughter, Elonda HuskyCassandra   4 children   Caffeine use: one daily   Travels to [place] frequently (every couple of months) for about 1 week at a time   OCCUPATION: custodial work for schools- retired     PHYSICAL EXAM  GENERAL EXAM/CONSTITUTIONAL: Vitals:  Vitals:   11/27/17 1011  BP: 131/68  Pulse: (!) 55  Weight: 169 lb (76.7 kg)  Height: 5\' 6"  (1.676 m)     Body mass index is 27.28 kg/m. Wt Readings from Last 3 Encounters:  11/27/17 169 lb (76.7 kg)  04/18/17 167 lb 3.2 oz (75.8 kg)  10/12/16 168 lb 3.2 oz (76.3 kg)     Patient is in no distress; well developed, nourished and groomed; neck is supple  CARDIOVASCULAR:  Examination of carotid arteries is normal; no carotid bruits  Regular rate and rhythm, no murmurs  Examination of peripheral vascular system by observation and palpation is normal  EYES:  Ophthalmoscopic exam of  optic discs and posterior segments is normal; no papilledema or hemorrhages  No exam data present  MUSCULOSKELETAL:  Gait, strength, tone, movements noted in Neurologic exam below  NEUROLOGIC: MENTAL STATUS:  MMSE - Mini Mental State Exam 11/27/2017  Orientation to time 2  Orientation to Place 2  Registration 3  Attention/ Calculation 0  Recall 0  Language- name 2 objects 2  Language- repeat 0  Language- follow 3 step command 3  Language- read & follow direction 0  Language-read & follow direction-comments stated he was unable to read it  Write a sentence 0  Write a sentence-comments stated he was unable  Copy design 0  Total score 12    awake, alert, oriented to person  DECR memory   DECR attention and concentration  DECR FLUENCY  fund of knowledge appropriate  CRANIAL NERVE:   2nd - no papilledema on fundoscopic exam  2nd, 3rd, 4th, 6th - pupils equal and reactive to light, visual fields full to confrontation, extraocular muscles intact, no nystagmus  5th - facial sensation symmetric  7th - facial strength symmetric  8th - hearing intact  9th - palate elevates symmetrically, uvula midline  11th - shoulder shrug symmetric  12th - tongue protrusion midline  MOTOR:   normal bulk and tone, full strength in the BUE, BLE  SENSORY:   normal and symmetric to light touch, temperature, vibration  COORDINATION:   finger-nose-finger, fine finger movements SLOW   REFLEXES:   deep tendon reflexes present and symmetric  GAIT/STATION:   narrow based gait     DIAGNOSTIC DATA (LABS, IMAGING, TESTING) - I reviewed patient records, labs, notes, testing and imaging myself where available.  No results found for: WBC, HGB, HCT, MCV, PLT No results found for: NA, K, CL, CO2, GLUCOSE, BUN, CREATININE, CALCIUM, PROT, ALBUMIN, AST, ALT, ALKPHOS, BILITOT, GFRNONAA, GFRAA No results found for: CHOL, HDL, LDLCALC, LDLDIRECT, TRIG, CHOLHDL No results found for:  HGBA1C No results found for: VITAMINB12 No results found for: TSH   06/06/13 MRI brain  - Slightly abnormal MRI scan  of the brain showing mild changes of chronic microvascular ischemia.      ASSESSMENT AND PLAN  77 y.o. year old male here with progressive short-term memory loss, confusion, behavior changes consistent with neurodegenerative dementia.   2015 MMSE 15 2019 MMSE 12  Dx: moderate dementia with behavioral disturbance (and intermittent inappropriate actions)  1. Moderate dementia with behavioral disturbance (HCC)     PLAN:  - continue donepezil - start memantine 10mg  at bedtime; increase to twice a day after 1-2 weeks - consider SSRI in future (for severe anxiety and behaviors) - increase safety / supervision; patient should not be left alone - caregiver resources provided - no driving  Meds ordered this encounter  Medications  . memantine (NAMENDA) 10 MG tablet    Sig: Take 1 tablet (10 mg total) by mouth 2 (two) times daily.    Dispense:  60 tablet    Refill:  12   Return pending symptoms; return if significantly worsen.    Suanne Marker, MD 11/27/2017, 11:01 AM Certified in Neurology, Neurophysiology and Neuroimaging  [facility] [address], [address] [place], [place] [address] [phone]

## 2018-05-25 DIAGNOSIS — Z1389 Encounter for screening for other disorder: Secondary | ICD-10-CM | POA: Diagnosis not present

## 2018-05-25 DIAGNOSIS — I10 Essential (primary) hypertension: Secondary | ICD-10-CM | POA: Diagnosis not present

## 2018-05-25 DIAGNOSIS — Z5181 Encounter for therapeutic drug level monitoring: Secondary | ICD-10-CM | POA: Diagnosis not present

## 2018-05-25 DIAGNOSIS — Z0000 Encounter for general adult medical examination without abnormal findings: Secondary | ICD-10-CM | POA: Diagnosis not present

## 2018-05-25 DIAGNOSIS — E1169 Type 2 diabetes mellitus with other specified complication: Secondary | ICD-10-CM | POA: Diagnosis not present

## 2018-05-25 DIAGNOSIS — R32 Unspecified urinary incontinence: Secondary | ICD-10-CM | POA: Diagnosis not present

## 2018-05-25 DIAGNOSIS — E7800 Pure hypercholesterolemia, unspecified: Secondary | ICD-10-CM | POA: Diagnosis not present

## 2018-05-25 DIAGNOSIS — F0390 Unspecified dementia without behavioral disturbance: Secondary | ICD-10-CM | POA: Diagnosis not present

## 2018-05-28 DIAGNOSIS — E7800 Pure hypercholesterolemia, unspecified: Secondary | ICD-10-CM | POA: Diagnosis not present

## 2018-05-28 DIAGNOSIS — R32 Unspecified urinary incontinence: Secondary | ICD-10-CM | POA: Diagnosis not present

## 2018-05-28 DIAGNOSIS — R739 Hyperglycemia, unspecified: Secondary | ICD-10-CM | POA: Diagnosis not present

## 2018-05-28 DIAGNOSIS — Z5181 Encounter for therapeutic drug level monitoring: Secondary | ICD-10-CM | POA: Diagnosis not present

## 2018-05-28 DIAGNOSIS — E1169 Type 2 diabetes mellitus with other specified complication: Secondary | ICD-10-CM | POA: Diagnosis not present

## 2018-05-28 DIAGNOSIS — I10 Essential (primary) hypertension: Secondary | ICD-10-CM | POA: Diagnosis not present

## 2018-07-19 DIAGNOSIS — E119 Type 2 diabetes mellitus without complications: Secondary | ICD-10-CM | POA: Diagnosis not present

## 2018-11-28 ENCOUNTER — Other Ambulatory Visit: Payer: Self-pay | Admitting: Pulmonary Disease

## 2018-11-30 ENCOUNTER — Other Ambulatory Visit: Payer: Self-pay | Admitting: Diagnostic Neuroimaging

## 2018-12-03 NOTE — Telephone Encounter (Signed)
Refilled x 1 month with note to pharmacy: further refills with PCP.

## 2018-12-15 ENCOUNTER — Other Ambulatory Visit: Payer: Self-pay | Admitting: Pulmonary Disease

## 2019-01-09 ENCOUNTER — Other Ambulatory Visit: Payer: Self-pay | Admitting: Diagnostic Neuroimaging

## 2019-01-16 ENCOUNTER — Other Ambulatory Visit: Payer: Self-pay | Admitting: Diagnostic Neuroimaging

## 2019-01-21 ENCOUNTER — Other Ambulatory Visit: Payer: Self-pay | Admitting: Diagnostic Neuroimaging

## 2019-02-25 ENCOUNTER — Ambulatory Visit
Admission: RE | Admit: 2019-02-25 | Discharge: 2019-02-25 | Disposition: A | Discharge status: Home / Self Care | Payer: Medicare Other | Source: Ambulatory Visit | Attending: Internal Medicine | Admitting: Internal Medicine

## 2019-02-25 ENCOUNTER — Other Ambulatory Visit: Payer: Self-pay | Admitting: Internal Medicine

## 2019-02-25 DIAGNOSIS — M545 Low back pain: Secondary | ICD-10-CM | POA: Diagnosis not present

## 2019-02-25 DIAGNOSIS — M5441 Lumbago with sciatica, right side: Secondary | ICD-10-CM | POA: Diagnosis not present

## 2019-02-25 DIAGNOSIS — F0390 Unspecified dementia without behavioral disturbance: Secondary | ICD-10-CM | POA: Diagnosis not present

## 2019-02-25 DIAGNOSIS — E7800 Pure hypercholesterolemia, unspecified: Secondary | ICD-10-CM | POA: Diagnosis not present

## 2019-02-25 DIAGNOSIS — I10 Essential (primary) hypertension: Secondary | ICD-10-CM | POA: Diagnosis not present

## 2019-02-25 DIAGNOSIS — E1169 Type 2 diabetes mellitus with other specified complication: Secondary | ICD-10-CM | POA: Diagnosis not present

## 2019-04-16 DIAGNOSIS — H903 Sensorineural hearing loss, bilateral: Secondary | ICD-10-CM | POA: Diagnosis not present

## 2019-06-25 DIAGNOSIS — E1169 Type 2 diabetes mellitus with other specified complication: Secondary | ICD-10-CM | POA: Diagnosis not present

## 2019-06-25 DIAGNOSIS — I10 Essential (primary) hypertension: Secondary | ICD-10-CM | POA: Diagnosis not present

## 2019-06-25 DIAGNOSIS — F0390 Unspecified dementia without behavioral disturbance: Secondary | ICD-10-CM | POA: Diagnosis not present

## 2019-06-25 DIAGNOSIS — E7800 Pure hypercholesterolemia, unspecified: Secondary | ICD-10-CM | POA: Diagnosis not present

## 2019-08-15 DIAGNOSIS — E119 Type 2 diabetes mellitus without complications: Secondary | ICD-10-CM | POA: Diagnosis not present

## 2019-11-23 DIAGNOSIS — Z23 Encounter for immunization: Secondary | ICD-10-CM | POA: Diagnosis not present

## 2019-12-27 DIAGNOSIS — Z1389 Encounter for screening for other disorder: Secondary | ICD-10-CM | POA: Diagnosis not present

## 2019-12-27 DIAGNOSIS — E7800 Pure hypercholesterolemia, unspecified: Secondary | ICD-10-CM | POA: Diagnosis not present

## 2019-12-27 DIAGNOSIS — F0390 Unspecified dementia without behavioral disturbance: Secondary | ICD-10-CM | POA: Diagnosis not present

## 2019-12-27 DIAGNOSIS — E1169 Type 2 diabetes mellitus with other specified complication: Secondary | ICD-10-CM | POA: Diagnosis not present

## 2019-12-27 DIAGNOSIS — Z0000 Encounter for general adult medical examination without abnormal findings: Secondary | ICD-10-CM | POA: Diagnosis not present

## 2019-12-27 DIAGNOSIS — E538 Deficiency of other specified B group vitamins: Secondary | ICD-10-CM | POA: Diagnosis not present

## 2019-12-27 DIAGNOSIS — I10 Essential (primary) hypertension: Secondary | ICD-10-CM | POA: Diagnosis not present

## 2019-12-27 DIAGNOSIS — Z23 Encounter for immunization: Secondary | ICD-10-CM | POA: Diagnosis not present

## 2020-01-10 DIAGNOSIS — Z23 Encounter for immunization: Secondary | ICD-10-CM | POA: Diagnosis not present

## 2020-01-19 DIAGNOSIS — Z20822 Contact with and (suspected) exposure to covid-19: Secondary | ICD-10-CM | POA: Diagnosis not present

## 2020-04-10 ENCOUNTER — Other Ambulatory Visit: Payer: Self-pay | Admitting: Pulmonary Disease

## 2020-08-11 DIAGNOSIS — Z7984 Long term (current) use of oral hypoglycemic drugs: Secondary | ICD-10-CM | POA: Diagnosis not present

## 2020-08-11 DIAGNOSIS — E7800 Pure hypercholesterolemia, unspecified: Secondary | ICD-10-CM | POA: Diagnosis not present

## 2020-08-11 DIAGNOSIS — E1169 Type 2 diabetes mellitus with other specified complication: Secondary | ICD-10-CM | POA: Diagnosis not present

## 2020-08-11 DIAGNOSIS — F0390 Unspecified dementia without behavioral disturbance: Secondary | ICD-10-CM | POA: Diagnosis not present

## 2020-08-11 DIAGNOSIS — I10 Essential (primary) hypertension: Secondary | ICD-10-CM | POA: Diagnosis not present

## 2020-09-28 DIAGNOSIS — Z111 Encounter for screening for respiratory tuberculosis: Secondary | ICD-10-CM | POA: Diagnosis not present

## 2020-09-28 DIAGNOSIS — R7612 Nonspecific reaction to cell mediated immunity measurement of gamma interferon antigen response without active tuberculosis: Secondary | ICD-10-CM | POA: Diagnosis not present

## 2020-10-12 ENCOUNTER — Other Ambulatory Visit: Payer: Self-pay | Admitting: Internal Medicine

## 2020-10-12 ENCOUNTER — Ambulatory Visit
Admission: RE | Admit: 2020-10-12 | Discharge: 2020-10-12 | Disposition: A | Discharge status: Home / Self Care | Payer: Medicare Other | Source: Ambulatory Visit | Attending: Internal Medicine | Admitting: Internal Medicine

## 2020-10-12 DIAGNOSIS — R7612 Nonspecific reaction to cell mediated immunity measurement of gamma interferon antigen response without active tuberculosis: Secondary | ICD-10-CM | POA: Diagnosis not present

## 2020-10-12 DIAGNOSIS — R7611 Nonspecific reaction to tuberculin skin test without active tuberculosis: Secondary | ICD-10-CM | POA: Diagnosis not present

## 2021-01-15 DIAGNOSIS — H25811 Combined forms of age-related cataract, right eye: Secondary | ICD-10-CM | POA: Diagnosis not present

## 2021-01-15 DIAGNOSIS — H25812 Combined forms of age-related cataract, left eye: Secondary | ICD-10-CM | POA: Diagnosis not present

## 2021-01-15 DIAGNOSIS — E119 Type 2 diabetes mellitus without complications: Secondary | ICD-10-CM | POA: Diagnosis not present

## 2021-01-15 DIAGNOSIS — H524 Presbyopia: Secondary | ICD-10-CM | POA: Diagnosis not present

## 2021-01-15 DIAGNOSIS — H43813 Vitreous degeneration, bilateral: Secondary | ICD-10-CM | POA: Diagnosis not present

## 2021-01-21 DIAGNOSIS — E1169 Type 2 diabetes mellitus with other specified complication: Secondary | ICD-10-CM | POA: Diagnosis not present

## 2021-01-21 DIAGNOSIS — E7800 Pure hypercholesterolemia, unspecified: Secondary | ICD-10-CM | POA: Diagnosis not present

## 2021-01-21 DIAGNOSIS — F03B0 Unspecified dementia, moderate, without behavioral disturbance, psychotic disturbance, mood disturbance, and anxiety: Secondary | ICD-10-CM | POA: Diagnosis not present

## 2021-01-21 DIAGNOSIS — Z1389 Encounter for screening for other disorder: Secondary | ICD-10-CM | POA: Diagnosis not present

## 2021-01-21 DIAGNOSIS — M25571 Pain in right ankle and joints of right foot: Secondary | ICD-10-CM | POA: Diagnosis not present

## 2021-01-21 DIAGNOSIS — Z0000 Encounter for general adult medical examination without abnormal findings: Secondary | ICD-10-CM | POA: Diagnosis not present

## 2021-01-21 DIAGNOSIS — I10 Essential (primary) hypertension: Secondary | ICD-10-CM | POA: Diagnosis not present

## 2021-01-21 DIAGNOSIS — Z79899 Other long term (current) drug therapy: Secondary | ICD-10-CM | POA: Diagnosis not present

## 2021-01-21 DIAGNOSIS — Z23 Encounter for immunization: Secondary | ICD-10-CM | POA: Diagnosis not present

## 2021-01-21 DIAGNOSIS — Z7984 Long term (current) use of oral hypoglycemic drugs: Secondary | ICD-10-CM | POA: Diagnosis not present

## 2021-07-30 DIAGNOSIS — F0390 Unspecified dementia without behavioral disturbance: Secondary | ICD-10-CM | POA: Diagnosis not present

## 2021-07-30 DIAGNOSIS — R32 Unspecified urinary incontinence: Secondary | ICD-10-CM | POA: Diagnosis not present

## 2021-07-30 DIAGNOSIS — E1169 Type 2 diabetes mellitus with other specified complication: Secondary | ICD-10-CM | POA: Diagnosis not present

## 2021-07-30 DIAGNOSIS — I10 Essential (primary) hypertension: Secondary | ICD-10-CM | POA: Diagnosis not present

## 2021-07-30 DIAGNOSIS — E7800 Pure hypercholesterolemia, unspecified: Secondary | ICD-10-CM | POA: Diagnosis not present

## 2022-01-06 DIAGNOSIS — Z23 Encounter for immunization: Secondary | ICD-10-CM | POA: Diagnosis not present

## 2022-02-11 DIAGNOSIS — F0390 Unspecified dementia without behavioral disturbance: Secondary | ICD-10-CM | POA: Diagnosis not present

## 2022-02-11 DIAGNOSIS — E7800 Pure hypercholesterolemia, unspecified: Secondary | ICD-10-CM | POA: Diagnosis not present

## 2022-02-11 DIAGNOSIS — I10 Essential (primary) hypertension: Secondary | ICD-10-CM | POA: Diagnosis not present

## 2022-02-11 DIAGNOSIS — E1169 Type 2 diabetes mellitus with other specified complication: Secondary | ICD-10-CM | POA: Diagnosis not present

## 2022-05-05 DIAGNOSIS — H2511 Age-related nuclear cataract, right eye: Secondary | ICD-10-CM | POA: Diagnosis not present

## 2022-05-05 DIAGNOSIS — E119 Type 2 diabetes mellitus without complications: Secondary | ICD-10-CM | POA: Diagnosis not present

## 2022-05-05 DIAGNOSIS — H43813 Vitreous degeneration, bilateral: Secondary | ICD-10-CM | POA: Diagnosis not present

## 2022-05-05 DIAGNOSIS — H2512 Age-related nuclear cataract, left eye: Secondary | ICD-10-CM | POA: Diagnosis not present

## 2022-05-06 DIAGNOSIS — Z23 Encounter for immunization: Secondary | ICD-10-CM | POA: Diagnosis not present

## 2022-05-06 DIAGNOSIS — Z0000 Encounter for general adult medical examination without abnormal findings: Secondary | ICD-10-CM | POA: Diagnosis not present

## 2022-06-20 DIAGNOSIS — M79643 Pain in unspecified hand: Secondary | ICD-10-CM | POA: Diagnosis not present

## 2022-07-08 DIAGNOSIS — M65332 Trigger finger, left middle finger: Secondary | ICD-10-CM | POA: Diagnosis not present

## 2022-07-18 NOTE — Progress Notes (Unsigned)
Office Visit Note   Patient: John Valdez           Date of Birth: 04/25/1940           MRN: [id number] Visit Date: 07/19/2022              Requested by: Renford Dills, MD [address] [place],  [place] [address] PCP: Renford Dills, MD   Assessment & Plan: Visit Diagnoses: No diagnosis found.  Plan: ***  Follow-Up Instructions: No follow-ups on file.   Orders:  No orders of the defined types were placed in this encounter.  No orders of the defined types were placed in this encounter.     Procedures: No procedures performed   Clinical Data: No additional findings.   Subjective: No chief complaint on file.   HPI  Review of Systems  Constitutional: Negative.   HENT: Negative.    Eyes: Negative.   Respiratory: Negative.    Cardiovascular: Negative.   Gastrointestinal: Negative.   Endocrine: Negative.   Genitourinary: Negative.   Skin: Negative.   Allergic/Immunologic: Negative.   Neurological: Negative.   Hematological: Negative.   Psychiatric/Behavioral: Negative.    All other systems reviewed and are negative.   Objective: Vital Signs: There were no vitals taken for this visit.  Physical Exam Vitals and nursing note reviewed.  Constitutional:      Appearance: He is well-developed.  HENT:     Head: Normocephalic and atraumatic.  Eyes:     Pupils: Pupils are equal, round, and reactive to light.  Pulmonary:     Effort: Pulmonary effort is normal.  Abdominal:     Palpations: Abdomen is soft.  Musculoskeletal:        General: Normal range of motion.     Cervical back: Neck supple.  Skin:    General: Skin is warm.  Neurological:     Mental Status: He is alert and oriented to person, place, and time.  Psychiatric:        Behavior: Behavior normal.        Thought Content: Thought content normal.        Judgment: Judgment normal.   Ortho Exam  Specialty Comments:  No specialty comments available.  Imaging: No results  found.   PMFS History: Patient Active Problem List   Diagnosis Date Noted  . Pulmonary emphysema (HCC) 04/18/2017  . Hypertension 05/21/2013  . B12 deficiency 05/21/2013   Past Medical History:  Diagnosis Date  . B12 deficiency   . BPH (benign prostatic hyperplasia)   . DM (diabetes mellitus) (HCC)    type 2  . HTN (hypertension)   . Hyperlipidemia   . Memory loss     Family History  Problem Relation Age of Onset  . Aneurysm Mother   . Colon cancer Father     Past Surgical History:  Procedure Laterality Date  . SHOULDER SURGERY Left 2000  . TOE SURGERY N/A    Social History   Occupational History  . Occupation: Retired  Tobacco Use  . Smoking status: Former    Packs/day: 2.00    Years: 25.00    Additional pack years: 0.00    Total pack years: 50.00    Types: Cigars, Cigarettes    Quit date: 01/11/1995    Years since quitting: 27.5  . Smokeless tobacco: Never  . Tobacco comments:    smoked cigarettes x64yrs, then smoked cigars  Substance and Sexual Activity  . Alcohol use: No  Comment: quit 30 yrs ago  . Drug use: No  . Sexual activity: Not on file

## 2022-07-19 ENCOUNTER — Ambulatory Visit (INDEPENDENT_AMBULATORY_CARE_PROVIDER_SITE_OTHER): Payer: Medicare Other | Admitting: Orthopaedic Surgery

## 2022-07-19 DIAGNOSIS — M65332 Trigger finger, left middle finger: Secondary | ICD-10-CM | POA: Diagnosis not present

## 2022-10-17 DIAGNOSIS — Z23 Encounter for immunization: Secondary | ICD-10-CM | POA: Diagnosis not present

## 2023-03-17 DIAGNOSIS — H9190 Unspecified hearing loss, unspecified ear: Secondary | ICD-10-CM | POA: Diagnosis not present

## 2023-04-07 DIAGNOSIS — M65332 Trigger finger, left middle finger: Secondary | ICD-10-CM | POA: Diagnosis not present

## 2023-05-08 DIAGNOSIS — H2511 Age-related nuclear cataract, right eye: Secondary | ICD-10-CM | POA: Diagnosis not present

## 2023-05-08 DIAGNOSIS — E119 Type 2 diabetes mellitus without complications: Secondary | ICD-10-CM | POA: Diagnosis not present

## 2023-05-08 DIAGNOSIS — H2512 Age-related nuclear cataract, left eye: Secondary | ICD-10-CM | POA: Diagnosis not present

## 2023-05-08 DIAGNOSIS — H43813 Vitreous degeneration, bilateral: Secondary | ICD-10-CM | POA: Diagnosis not present

## 2023-06-22 DIAGNOSIS — Z1331 Encounter for screening for depression: Secondary | ICD-10-CM | POA: Diagnosis not present

## 2023-06-22 DIAGNOSIS — E538 Deficiency of other specified B group vitamins: Secondary | ICD-10-CM | POA: Diagnosis not present

## 2023-06-22 DIAGNOSIS — E1169 Type 2 diabetes mellitus with other specified complication: Secondary | ICD-10-CM | POA: Diagnosis not present

## 2023-06-22 DIAGNOSIS — R32 Unspecified urinary incontinence: Secondary | ICD-10-CM | POA: Diagnosis not present

## 2023-06-22 DIAGNOSIS — Z5181 Encounter for therapeutic drug level monitoring: Secondary | ICD-10-CM | POA: Diagnosis not present

## 2023-06-22 DIAGNOSIS — Z0000 Encounter for general adult medical examination without abnormal findings: Secondary | ICD-10-CM | POA: Diagnosis not present

## 2023-06-22 DIAGNOSIS — F0390 Unspecified dementia without behavioral disturbance: Secondary | ICD-10-CM | POA: Diagnosis not present

## 2023-06-22 DIAGNOSIS — I10 Essential (primary) hypertension: Secondary | ICD-10-CM | POA: Diagnosis not present

## 2023-06-22 DIAGNOSIS — E7800 Pure hypercholesterolemia, unspecified: Secondary | ICD-10-CM | POA: Diagnosis not present

## 2023-06-23 DIAGNOSIS — E1169 Type 2 diabetes mellitus with other specified complication: Secondary | ICD-10-CM | POA: Diagnosis not present

## 2023-06-23 DIAGNOSIS — Z5181 Encounter for therapeutic drug level monitoring: Secondary | ICD-10-CM | POA: Diagnosis not present

## 2023-06-23 DIAGNOSIS — E7800 Pure hypercholesterolemia, unspecified: Secondary | ICD-10-CM | POA: Diagnosis not present

## 2023-06-23 DIAGNOSIS — I10 Essential (primary) hypertension: Secondary | ICD-10-CM | POA: Diagnosis not present

## 2023-06-23 DIAGNOSIS — E538 Deficiency of other specified B group vitamins: Secondary | ICD-10-CM | POA: Diagnosis not present

## 2023-11-17 DIAGNOSIS — Z23 Encounter for immunization: Secondary | ICD-10-CM | POA: Diagnosis not present

## 2023-12-12 DIAGNOSIS — M65332 Trigger finger, left middle finger: Secondary | ICD-10-CM | POA: Diagnosis not present
